# Patient Record
Sex: Male | Born: 1970 | Race: Black or African American | Hispanic: No | Marital: Married | Smoking: Never smoker
Health system: Southern US, Community
[De-identification: ages and names within clinical notes are randomized; demographics above are authoritative.]

## PROBLEM LIST (undated history)

## (undated) DIAGNOSIS — I10 Essential (primary) hypertension: Secondary | ICD-10-CM

## (undated) DIAGNOSIS — F419 Anxiety disorder, unspecified: Secondary | ICD-10-CM

## (undated) HISTORY — PX: NO PAST SURGERIES: SHX2092

---

## 1997-11-11 ENCOUNTER — Emergency Department (HOSPITAL_COMMUNITY): Admission: EM | Admit: 1997-11-11 | Discharge: 1997-11-11 | Payer: Self-pay | Admitting: Emergency Medicine

## 2011-03-17 ENCOUNTER — Other Ambulatory Visit: Payer: Self-pay | Admitting: Occupational Medicine

## 2011-03-17 ENCOUNTER — Ambulatory Visit: Payer: Self-pay

## 2011-03-17 DIAGNOSIS — R52 Pain, unspecified: Secondary | ICD-10-CM

## 2012-10-28 ENCOUNTER — Emergency Department (HOSPITAL_COMMUNITY)
Admission: EM | Admit: 2012-10-28 | Discharge: 2012-10-28 | Disposition: A | Discharge status: Home / Self Care | Payer: 59 | Attending: Emergency Medicine | Admitting: Emergency Medicine

## 2012-10-28 DIAGNOSIS — T783XXA Angioneurotic edema, initial encounter: Secondary | ICD-10-CM

## 2012-10-28 DIAGNOSIS — Y9389 Activity, other specified: Secondary | ICD-10-CM | POA: Insufficient documentation

## 2012-10-28 DIAGNOSIS — Y929 Unspecified place or not applicable: Secondary | ICD-10-CM | POA: Insufficient documentation

## 2012-10-28 DIAGNOSIS — IMO0002 Reserved for concepts with insufficient information to code with codable children: Secondary | ICD-10-CM | POA: Insufficient documentation

## 2012-10-28 DIAGNOSIS — Z79899 Other long term (current) drug therapy: Secondary | ICD-10-CM | POA: Insufficient documentation

## 2012-10-28 MED ORDER — METHYLPREDNISOLONE SODIUM SUCC 125 MG IJ SOLR
125.0000 mg | Freq: Once | INTRAMUSCULAR | Status: AC
  Administered 2012-10-28: 125 mg via INTRAVENOUS
  Filled 2012-10-28: qty 2

## 2012-10-28 MED ORDER — FAMOTIDINE IN NACL 20-0.9 MG/50ML-% IV SOLN
20.0000 mg | Freq: Once | INTRAVENOUS | Status: AC
  Administered 2012-10-28: 20 mg via INTRAVENOUS
  Filled 2012-10-28: qty 50

## 2012-10-28 MED ORDER — HYDROCODONE-ACETAMINOPHEN 5-325 MG PO TABS: 1.0000 | ORAL_TABLET | Freq: Once | ORAL | Status: DC

## 2012-10-28 NOTE — ED Provider Notes (Signed)
History     CSN: [id number]  Arrival date & time 10/28/12  0015   First MD Initiated Contact with Patient 10/28/12 0030      Chief Complaint  Patient presents with  . Allergic Reaction    (Consider location/radiation/quality/duration/timing/severity/associated sxs/prior treatment) Patient is a 42 y.o. male presenting with allergic reaction. The history is provided by the patient.  Allergic Reaction The primary symptoms are  angioedema. The current episode started 1 to 2 hours ago. The problem has been rapidly worsening.  The onset of the reaction was associated with eating.    No past medical history on file.  No past surgical history on file.  No family history on file.  History  Substance Use Topics  . Smoking status: Not on file  . Smokeless tobacco: Not on file  . Alcohol Use: Not on file      Review of Systems  All other systems reviewed and are negative.    Allergies  Review of patient's allergies indicates not on file.  Home Medications  No current outpatient prescriptions on file.  BP 169/74  Pulse 78  Temp(Src) 98.3 F (36.8 C) (Oral)  SpO2 97%  Physical Exam  Constitutional: He is oriented to person, place, and time. He appears well-developed and well-nourished.  HENT:  Head: Normocephalic and atraumatic.  Upper lip swelling.  No lingual or pharyngeal involvement  Eyes: Conjunctivae are normal. Pupils are equal, round, and reactive to light.  Neck: Normal range of motion. Neck supple.  Cardiovascular: Normal rate, regular rhythm, normal heart sounds and intact distal pulses.   Pulmonary/Chest: Effort normal and breath sounds normal.  Abdominal: Soft. Bowel sounds are normal.  Neurological: He is alert and oriented to person, place, and time.  Skin: Skin is warm and dry.  Psychiatric: He has a normal mood and affect. His behavior is normal. Judgment and thought content normal.    ED Course  Procedures (including critical care time)  Labs  Reviewed - No data to display No results found.   No diagnosis found.    MDM  + angioedema, on lisinopril.  Did not take today.  Will h2, steroid, reassess        Rosanne Ashing, MD 10/28/12 [phone]

## 2012-10-28 NOTE — ED Notes (Signed)
Pt states that after eating spaghetti and meatballs. He self administered diphenhydramine x 2 (expired), and 10 ml of liquid diphenhydramine. Pt has a swollen upper lip, and denies difficulty breathing or swallowing.

## 2018-11-19 ENCOUNTER — Emergency Department (HOSPITAL_COMMUNITY)
Admission: EM | Admit: 2018-11-19 | Discharge: 2018-11-19 | Disposition: A | Discharge status: Home / Self Care | Payer: Worker's Compensation | Attending: Emergency Medicine | Admitting: Emergency Medicine

## 2018-11-19 ENCOUNTER — Emergency Department (HOSPITAL_COMMUNITY): Payer: Worker's Compensation

## 2018-11-19 ENCOUNTER — Encounter (HOSPITAL_COMMUNITY): Payer: Self-pay | Admitting: Emergency Medicine

## 2018-11-19 DIAGNOSIS — Y9389 Activity, other specified: Secondary | ICD-10-CM | POA: Diagnosis not present

## 2018-11-19 DIAGNOSIS — S50311A Abrasion of right elbow, initial encounter: Secondary | ICD-10-CM | POA: Insufficient documentation

## 2018-11-19 DIAGNOSIS — S8255XA Nondisplaced fracture of medial malleolus of left tibia, initial encounter for closed fracture: Secondary | ICD-10-CM | POA: Diagnosis not present

## 2018-11-19 DIAGNOSIS — W1789XA Other fall from one level to another, initial encounter: Secondary | ICD-10-CM | POA: Insufficient documentation

## 2018-11-19 DIAGNOSIS — I10 Essential (primary) hypertension: Secondary | ICD-10-CM | POA: Diagnosis not present

## 2018-11-19 DIAGNOSIS — S89392A Other physeal fracture of lower end of left fibula, initial encounter for closed fracture: Secondary | ICD-10-CM | POA: Diagnosis not present

## 2018-11-19 DIAGNOSIS — Z23 Encounter for immunization: Secondary | ICD-10-CM | POA: Diagnosis not present

## 2018-11-19 DIAGNOSIS — Y990 Civilian activity done for income or pay: Secondary | ICD-10-CM | POA: Diagnosis not present

## 2018-11-19 DIAGNOSIS — Y9289 Other specified places as the place of occurrence of the external cause: Secondary | ICD-10-CM | POA: Insufficient documentation

## 2018-11-19 DIAGNOSIS — S82892A Other fracture of left lower leg, initial encounter for closed fracture: Secondary | ICD-10-CM

## 2018-11-19 DIAGNOSIS — S99912A Unspecified injury of left ankle, initial encounter: Secondary | ICD-10-CM | POA: Diagnosis present

## 2018-11-19 HISTORY — DX: Essential (primary) hypertension: I10

## 2018-11-19 MED ORDER — TETANUS-DIPHTH-ACELL PERTUSSIS 5-2.5-18.5 LF-MCG/0.5 IM SUSP
0.5000 mL | Freq: Once | INTRAMUSCULAR | Status: AC
  Administered 2018-11-19: 11:00:00 0.5 mL via INTRAMUSCULAR
  Filled 2018-11-19: qty 0.5

## 2018-11-19 MED ORDER — HYDROCODONE-ACETAMINOPHEN 5-325 MG PO TABS
1.0000 | ORAL_TABLET | Freq: Once | ORAL | Status: AC
  Administered 2018-11-19: 1 via ORAL
  Filled 2018-11-19: qty 1

## 2018-11-19 MED ORDER — HYDROCODONE-ACETAMINOPHEN 5-325 MG PO TABS: 1.0000 | ORAL_TABLET | Freq: Four times a day (QID) | ORAL | 0 refills | Status: DC | PRN

## 2018-11-19 NOTE — Progress Notes (Signed)
Orthopedic Tech Progress Note Patient Details:  Randy Stewart 1970-10-05 [id number]  Ortho Devices Type of Ortho Device: Crutches, Post (short leg) splint, Stirrup splint Ortho Device/Splint Location: lle Ortho Device/Splint Interventions: Ordered, Application, Adjustment   Post Interventions Patient Tolerated: Well Instructions Provided: Care of device, Adjustment of device   Trinna Post 11/19/2018, 12:05 PM

## 2018-11-19 NOTE — ED Notes (Signed)
Paged ortho tech 

## 2018-11-19 NOTE — Consult Note (Signed)
Reason for Consult:Left ankle fx Referring Physician: D Eustaquio MaizeYelverton  Abbas J Stewart is an 48 y.o. male.  HPI: Randy Stewart was on the back of a semi when it moved away from the loading dock and he fell to the ground. He landed on his feet and had immediate pain in his left ankle and knee but worst in the ankle. He was unable to bear weight afterwards and came to the ED for evaluation. X-rays showed an ankle fx and orthopedic surgery was consulted. He c/o localized pain to the ankle. His knee is not painful anymore.  Past Medical History:  Diagnosis Date  . Hypertension     History reviewed. No pertinent surgical history.  History reviewed. No pertinent family history.  Social History:  reports that he has never smoked. He has never used smokeless tobacco. He reports that he does not drink alcohol. No history on file for drug.  Allergies:  Allergies  Allergen Reactions  . Lisinopril     Medications: I have reviewed the patient's current medications.  No results found for this or any previous visit (from the past 48 hour(s)).  Dg Ankle Complete Left  Result Date: 11/19/2018 CLINICAL DATA:  Left ankle pain after falling off the back of a truck. EXAM: LEFT ANKLE COMPLETE - 3+ VIEW COMPARISON:  None. FINDINGS: Small avulsion fracture at the tip of the medial malleolus. Essentially nondisplaced fracture of the distal fibular metaphysis extending into the syndesmosis. No dislocation. The ankle mortise is symmetric. The talar dome is intact. Moderate tibiotalar joint effusion. Joint spaces are preserved. Bone mineralization is normal. Small plantar and Achilles enthesophytes. Moderate lateral and mild medial hindfoot soft tissue swelling. IMPRESSION: 1. Essentially nondisplaced fracture of the distal fibular metaphysis extending into the syndesmosis. No ankle mortise disruption. 2. Small avulsion fracture of the medial malleolus. Electronically Signed   By: Obie DredgeWilliam T Derry M.D.   On: 11/19/2018 11:02     Review of Systems  Constitutional: Negative for weight loss.  HENT: Negative for ear discharge, ear pain, hearing loss and tinnitus.   Eyes: Negative for blurred vision, double vision, photophobia and pain.  Respiratory: Negative for cough, sputum production and shortness of breath.   Cardiovascular: Negative for chest pain.  Gastrointestinal: Negative for abdominal pain, nausea and vomiting.  Genitourinary: Negative for dysuria, flank pain, frequency and urgency.  Musculoskeletal: Positive for joint pain (Left ankle, left knee). Negative for back pain, falls, myalgias and neck pain.  Neurological: Negative for dizziness, tingling, sensory change, focal weakness, loss of consciousness and headaches.  Endo/Heme/Allergies: Does not bruise/bleed easily.  Psychiatric/Behavioral: Negative for depression, memory loss and substance abuse. The patient is not nervous/anxious.    Blood pressure (!) 142/82, pulse 60, temperature 98.8 F (37.1 C), temperature source Oral, resp. rate 16, SpO2 100 %. Physical Exam  Constitutional: He appears well-developed and well-nourished. No distress.  HENT:  Head: Normocephalic and atraumatic.  Eyes: Conjunctivae are normal. Right eye exhibits no discharge. Left eye exhibits no discharge. No scleral icterus.  Neck: Normal range of motion.  Cardiovascular: Normal rate and regular rhythm.  Respiratory: Effort normal. No respiratory distress.  Musculoskeletal:     Comments: LLE No traumatic wounds, ecchymosis, or rash  Ankle mod TTP, mild TTP medial knee  No knee or ankle effusion  Knee stable to varus/ valgus and anterior/posterior stress  Sens DPN, SPN, TN intact  Motor EHL 5/5  DP 1+, No significant edema  Neurological: He is alert.  Skin: Skin is warm and dry.  He is not diaphoretic.  Psychiatric: He has a normal mood and affect. His behavior is normal.    Assessment/Plan: Left ankle fx -- Splint, NWB. F/u with Dr. Carola Frost in office next week. Likely  grade 1 MCL strain HTN    Freeman Caldron, PA-C Orthopedic Surgery [phone] 11/19/2018, 12:07 PM

## 2018-11-19 NOTE — ED Triage Notes (Signed)
Pt here from work after falling out of the back of a trailer , did not hit his head , no loc , pt is c/o left ankle leg and hip pain

## 2018-11-19 NOTE — Discharge Instructions (Signed)
You have been diagnosed with a broken left ankle.  Use crutches when walking.  Keep your ankle elevated when resting.  Take vicodin as needed for pain but be aware that it can cause drowsiness. Please call and follow closely with orthopedist for further management of your condition.

## 2018-11-19 NOTE — ED Notes (Signed)
Patient transported to X-ray 

## 2018-11-19 NOTE — ED Provider Notes (Signed)
[facility]   CSN: [id number] Arrival date & time: 11/19/18  1018    History   Chief Complaint Chief Complaint  Patient presents with  . Leg Injury    HPI Randy Stewart is a 48 y.o. male.     The history is provided by the patient. No language interpreter was used.     48 year old male presented for evaluation of a fall.  Patient report while at work today he fell off the back of a tractor trailer when his coworker drove off.  He landed awkwardly on his left leg and injured his left ankle.  He also struck his right elbow and suffered abrasion.  Pain is primarily to left ankle described as a sharp sensation radiates up his leg, 8 out of 10, worsening with movement.  He denies hitting his head or loss of consciousness.  He denies any pain to his chest abdomen or his back.  He denies any numbness or weakness.  No specific treatment tried.  He has not ambulated since the fall.  He is not up-to-date with tetanus.  Past Medical History:  Diagnosis Date  . Hypertension     There are no active problems to display for this patient.   History reviewed. No pertinent surgical history.      Home Medications    Prior to Admission medications   Not on File    Family History History reviewed. No pertinent family history.  Social History Social History   Tobacco Use  . Smoking status: Never Smoker  . Smokeless tobacco: Never Used  Substance Use Topics  . Alcohol use: Never    Frequency: Never  . Drug use: Not on file     Allergies   Lisinopril   Review of Systems Review of Systems  Musculoskeletal: Positive for joint swelling.  Skin: Positive for wound.  Neurological: Negative for numbness.     Physical Exam Updated Vital Signs BP (!) 163/85   Pulse 62   Temp 98.8 F (37.1 C) (Oral)   Resp 18   SpO2 100%   Physical Exam Vitals signs and nursing note reviewed.  Constitutional:      General: He is  not in acute distress.    Appearance: He is well-developed.  HENT:     Head: Atraumatic.  Eyes:     Conjunctiva/sclera: Conjunctivae normal.  Neck:     Musculoskeletal: Neck supple.  Musculoskeletal:        General: Tenderness (Left ankle: Edema and tenderness noted about the ankle with tenderness to both medial and lateral malleoli but no obvious deformity.  Intact dorsalis pedis pulse.) present.     Comments: Left hip, knee, and foot are nontender.  No midline spine tenderness.  Skin:    Findings: No rash.     Comments: Abrasion noted about the right elbow with normal elbow flexion and extension and no crepitus or deformity.  Neurological:     Mental Status: He is alert.      ED Treatments / Results  Labs (all labs ordered are listed, but only abnormal results are displayed) Labs Reviewed - No data to display  EKG None  Radiology Dg Ankle Complete Left  Result Date: 11/19/2018 CLINICAL DATA:  Left ankle pain after falling off the back of a truck. EXAM: LEFT ANKLE COMPLETE - 3+ VIEW COMPARISON:  None. FINDINGS: Small avulsion fracture at the tip of the medial malleolus. Essentially nondisplaced fracture of the distal  fibular metaphysis extending into the syndesmosis. No dislocation. The ankle mortise is symmetric. The talar dome is intact. Moderate tibiotalar joint effusion. Joint spaces are preserved. Bone mineralization is normal. Small plantar and Achilles enthesophytes. Moderate lateral and mild medial hindfoot soft tissue swelling. IMPRESSION: 1. Essentially nondisplaced fracture of the distal fibular metaphysis extending into the syndesmosis. No ankle mortise disruption. 2. Small avulsion fracture of the medial malleolus. Electronically Signed   By: Obie Dredge M.D.   On: 11/19/2018 11:02    Procedures Procedures (including critical care time)  Medications Ordered in ED Medications  Tdap (BOOSTRIX) injection 0.5 mL (0.5 mLs Intramuscular Given 11/19/18 1101)   HYDROcodone-acetaminophen (NORCO/VICODIN) 5-325 MG per tablet 1 tablet (1 tablet Oral Given 11/19/18 1100)     Initial Impression / Assessment and Plan / ED Course  I have reviewed the triage vital signs and the nursing notes.  Pertinent labs & imaging results that were available during my care of the patient were reviewed by me and considered in my medical decision making (see chart for details).        BP (!) 140/56   Pulse 68   Temp 98.8 F (37.1 C) (Oral)   Resp 16   SpO2 97%    Final Clinical Impressions(s) / ED Diagnoses   Final diagnoses:  Ankle fracture, left, closed, initial encounter    ED Discharge Orders         Ordered    HYDROcodone-acetaminophen (NORCO/VICODIN) 5-325 MG tablet  Every 6 hours PRN     11/19/18 1206         10:40 AM Patient fell off a tractor trailer at work today prior to arrival.  Pain primarily to the left ankle.  X-ray ordered.  He also has an abrasion to his right elbow but with normal elbow flexion extension.  Tdap given, pain medication given.  11:58 AM Xray of L ankle demonstrates nondisplaced fracture of the distal fibular metaphysis extending into the syndesmosis.  This is a closed fracture.  Pt placed in posterior/stirrup splint, with crutches provided. I have consulted with ortho for close f/u.   Fayrene Helper, PA-C 11/19/18 1213    Loren Racer, MD 11/21/18 [phone]

## 2018-11-19 NOTE — ED Notes (Signed)
Patient verbalizes understanding of discharge instructions. Opportunity for questioning and answers were provided. Pt discharged from ED. 

## 2018-11-24 ENCOUNTER — Other Ambulatory Visit: Payer: Self-pay

## 2018-11-24 ENCOUNTER — Encounter (HOSPITAL_BASED_OUTPATIENT_CLINIC_OR_DEPARTMENT_OTHER): Payer: Self-pay | Admitting: *Deleted

## 2018-11-24 ENCOUNTER — Other Ambulatory Visit (HOSPITAL_COMMUNITY): Payer: Self-pay | Admitting: Orthopedic Surgery

## 2018-11-26 ENCOUNTER — Other Ambulatory Visit: Payer: Self-pay

## 2018-11-26 ENCOUNTER — Encounter (HOSPITAL_BASED_OUTPATIENT_CLINIC_OR_DEPARTMENT_OTHER)
Admission: RE | Admit: 2018-11-26 | Discharge: 2018-11-26 | Disposition: A | Discharge status: Home / Self Care | Payer: Worker's Compensation | Source: Ambulatory Visit | Attending: Orthopedic Surgery | Admitting: Orthopedic Surgery

## 2018-11-26 DIAGNOSIS — Z01812 Encounter for preprocedural laboratory examination: Secondary | ICD-10-CM | POA: Diagnosis present

## 2018-11-26 LAB — BASIC METABOLIC PANEL
Anion gap: 9 (ref 5–15)
BUN: 14 mg/dL (ref 6–20)
CO2: 26 mmol/L (ref 22–32)
Calcium: 9.8 mg/dL (ref 8.9–10.3)
Chloride: 107 mmol/L (ref 98–111)
Creatinine, Ser: 0.85 mg/dL (ref 0.61–1.24)
GFR calc Af Amer: >60 mL/min (ref >60)
GFR calc non Af Amer: >60 mL/min (ref >60)
Glucose, Bld: 107 mg/dL — ABNORMAL HIGH (ref 70–99)
Potassium: 5 mmol/L (ref 3.5–5.1)
Sodium: 142 mmol/L (ref 135–145)

## 2018-11-26 NOTE — Progress Notes (Signed)
Ensure pre surgery drink given with instructions to complete by 0445 dos, pt verbalized understanding. 

## 2018-11-30 ENCOUNTER — Ambulatory Visit (HOSPITAL_BASED_OUTPATIENT_CLINIC_OR_DEPARTMENT_OTHER)
Admission: RE | Admit: 2018-11-30 | Discharge: 2018-11-30 | Disposition: A | Discharge status: Home / Self Care | Payer: Worker's Compensation | Attending: Orthopedic Surgery | Admitting: Orthopedic Surgery

## 2018-11-30 ENCOUNTER — Ambulatory Visit (HOSPITAL_BASED_OUTPATIENT_CLINIC_OR_DEPARTMENT_OTHER): Payer: Worker's Compensation | Admitting: Certified Registered"

## 2018-11-30 ENCOUNTER — Encounter (HOSPITAL_BASED_OUTPATIENT_CLINIC_OR_DEPARTMENT_OTHER)
Admission: RE | Disposition: A | Discharge status: Home / Self Care | Payer: Self-pay | Source: Home / Self Care | Attending: Orthopedic Surgery

## 2018-11-30 ENCOUNTER — Encounter (HOSPITAL_BASED_OUTPATIENT_CLINIC_OR_DEPARTMENT_OTHER): Payer: Self-pay | Admitting: Certified Registered"

## 2018-11-30 DIAGNOSIS — I10 Essential (primary) hypertension: Secondary | ICD-10-CM | POA: Diagnosis not present

## 2018-11-30 DIAGNOSIS — F419 Anxiety disorder, unspecified: Secondary | ICD-10-CM | POA: Diagnosis not present

## 2018-11-30 DIAGNOSIS — Z6841 Body Mass Index (BMI) 40.0 and over, adult: Secondary | ICD-10-CM | POA: Diagnosis not present

## 2018-11-30 DIAGNOSIS — S82842A Displaced bimalleolar fracture of left lower leg, initial encounter for closed fracture: Secondary | ICD-10-CM | POA: Diagnosis present

## 2018-11-30 DIAGNOSIS — Z888 Allergy status to other drugs, medicaments and biological substances status: Secondary | ICD-10-CM | POA: Diagnosis not present

## 2018-11-30 DIAGNOSIS — Z79899 Other long term (current) drug therapy: Secondary | ICD-10-CM | POA: Insufficient documentation

## 2018-11-30 HISTORY — PX: ORIF ANKLE FRACTURE: SHX5408

## 2018-11-30 HISTORY — DX: Anxiety disorder, unspecified: F41.9

## 2018-11-30 SURGERY — OPEN REDUCTION INTERNAL FIXATION (ORIF) ANKLE FRACTURE
Anesthesia: General | Laterality: Left

## 2018-11-30 MED ORDER — 0.9 % SODIUM CHLORIDE (POUR BTL) OPTIME
TOPICAL | Status: DC | PRN
  Administered 2018-11-30: 09:00:00 1000 mL

## 2018-11-30 MED ORDER — ONDANSETRON HCL 4 MG/2ML IJ SOLN
INTRAMUSCULAR | Status: DC | PRN
  Administered 2018-11-30: 4 mg via INTRAVENOUS

## 2018-11-30 MED ORDER — SCOPOLAMINE 1 MG/3DAYS TD PT72: 1.0000 | MEDICATED_PATCH | Freq: Once | TRANSDERMAL | Status: DC | PRN

## 2018-11-30 MED ORDER — DEXTROSE 5 % IV SOLN
3.0000 g | Freq: Once | INTRAVENOUS | Status: AC
  Administered 2018-11-30: 09:00:00 3 g via INTRAVENOUS

## 2018-11-30 MED ORDER — MEPERIDINE HCL 25 MG/ML IJ SOLN: 6.2500 mg | INTRAMUSCULAR | Status: DC | PRN

## 2018-11-30 MED ORDER — HYDROMORPHONE HCL 1 MG/ML IJ SOLN: 0.2500 mg | INTRAMUSCULAR | Status: DC | PRN

## 2018-11-30 MED ORDER — DOCUSATE SODIUM 100 MG PO CAPS: 100.0000 mg | ORAL_CAPSULE | Freq: Two times a day (BID) | ORAL | 0 refills | Status: DC

## 2018-11-30 MED ORDER — ASPIRIN EC 81 MG PO TBEC: 81.0000 mg | DELAYED_RELEASE_TABLET | Freq: Two times a day (BID) | ORAL | 0 refills | Status: DC

## 2018-11-30 MED ORDER — CEFAZOLIN SODIUM-DEXTROSE 1-4 GM/50ML-% IV SOLN
INTRAVENOUS | Status: AC
  Filled 2018-11-30: qty 50

## 2018-11-30 MED ORDER — CHLORHEXIDINE GLUCONATE 4 % EX LIQD: 60.0000 mL | Freq: Once | CUTANEOUS | Status: DC

## 2018-11-30 MED ORDER — SENNA 8.6 MG PO TABS: 2.0000 | ORAL_TABLET | Freq: Two times a day (BID) | ORAL | 0 refills | Status: DC

## 2018-11-30 MED ORDER — OXYCODONE HCL 5 MG/5ML PO SOLN: 5.0000 mg | Freq: Once | ORAL | Status: DC | PRN

## 2018-11-30 MED ORDER — MIDAZOLAM HCL 2 MG/2ML IJ SOLN
INTRAMUSCULAR | Status: AC
  Filled 2018-11-30: qty 2

## 2018-11-30 MED ORDER — ROPIVACAINE HCL 5 MG/ML IJ SOLN
INTRAMUSCULAR | Status: DC | PRN
  Administered 2018-11-30: 30 mL via PERINEURAL

## 2018-11-30 MED ORDER — LACTATED RINGERS IV SOLN
INTRAVENOUS | Status: DC
  Administered 2018-11-30: 07:00:00 via INTRAVENOUS

## 2018-11-30 MED ORDER — PROPOFOL 10 MG/ML IV BOLUS
INTRAVENOUS | Status: DC | PRN
  Administered 2018-11-30: 300 mg via INTRAVENOUS

## 2018-11-30 MED ORDER — DEXAMETHASONE SODIUM PHOSPHATE 10 MG/ML IJ SOLN
INTRAMUSCULAR | Status: DC | PRN
  Administered 2018-11-30: 10 mg via INTRAVENOUS

## 2018-11-30 MED ORDER — PROMETHAZINE HCL 25 MG/ML IJ SOLN: 6.2500 mg | INTRAMUSCULAR | Status: DC | PRN

## 2018-11-30 MED ORDER — PROPOFOL 500 MG/50ML IV EMUL
INTRAVENOUS | Status: DC | PRN
  Administered 2018-11-30: 25 ug/kg/min via INTRAVENOUS

## 2018-11-30 MED ORDER — FENTANYL CITRATE (PF) 100 MCG/2ML IJ SOLN
50.0000 ug | INTRAMUSCULAR | Status: DC | PRN
  Administered 2018-11-30: 100 ug via INTRAVENOUS

## 2018-11-30 MED ORDER — LIDOCAINE HCL (CARDIAC) PF 100 MG/5ML IV SOSY
PREFILLED_SYRINGE | INTRAVENOUS | Status: DC | PRN
  Administered 2018-11-30: 30 mg via INTRAVENOUS

## 2018-11-30 MED ORDER — FENTANYL CITRATE (PF) 100 MCG/2ML IJ SOLN
INTRAMUSCULAR | Status: AC
  Filled 2018-11-30: qty 2

## 2018-11-30 MED ORDER — SODIUM CHLORIDE 0.9 % IV SOLN: INTRAVENOUS | Status: DC

## 2018-11-30 MED ORDER — CEFAZOLIN SODIUM-DEXTROSE 2-4 GM/100ML-% IV SOLN
INTRAVENOUS | Status: AC
  Filled 2018-11-30: qty 100

## 2018-11-30 MED ORDER — VANCOMYCIN HCL 500 MG IV SOLR
INTRAVENOUS | Status: DC | PRN
  Administered 2018-11-30: 500 mg via TOPICAL

## 2018-11-30 MED ORDER — OXYCODONE HCL 5 MG PO TABS: 5.0000 mg | ORAL_TABLET | Freq: Once | ORAL | Status: DC | PRN

## 2018-11-30 MED ORDER — CEFAZOLIN SODIUM-DEXTROSE 2-4 GM/100ML-% IV SOLN: 2.0000 g | INTRAVENOUS | Status: DC

## 2018-11-30 MED ORDER — MIDAZOLAM HCL 2 MG/2ML IJ SOLN
1.0000 mg | INTRAMUSCULAR | Status: DC | PRN
  Administered 2018-11-30: 2 mg via INTRAVENOUS

## 2018-11-30 MED ORDER — OXYCODONE HCL 5 MG PO TABS: 5.0000 mg | ORAL_TABLET | ORAL | 0 refills | Status: AC | PRN

## 2018-11-30 SURGICAL SUPPLY — 76 items
APL PRP STRL LF DISP 70% ISPRP (MISCELLANEOUS) ×1
BANDAGE ESMARK 6X9 LF (GAUZE/BANDAGES/DRESSINGS) ×1 IMPLANT
BIT DRILL 2.5X2.75 QC CALB (BIT) ×3 IMPLANT
BIT DRILL 3.5X5.5 QC CALB (BIT) ×2 IMPLANT
BLADE SURG 15 STRL LF DISP TIS (BLADE) ×2 IMPLANT
BLADE SURG 15 STRL SS (BLADE) ×6
BNDG CMPR 9X4 STRL LF SNTH (GAUZE/BANDAGES/DRESSINGS)
BNDG CMPR 9X6 STRL LF SNTH (GAUZE/BANDAGES/DRESSINGS) ×1
BNDG COHESIVE 4X5 TAN STRL (GAUZE/BANDAGES/DRESSINGS) ×3 IMPLANT
BNDG COHESIVE 6X5 TAN STRL LF (GAUZE/BANDAGES/DRESSINGS) ×3 IMPLANT
BNDG ESMARK 4X9 LF (GAUZE/BANDAGES/DRESSINGS) IMPLANT
BNDG ESMARK 6X9 LF (GAUZE/BANDAGES/DRESSINGS) ×3
CANISTER SUCT 1200ML W/VALVE (MISCELLANEOUS) ×3 IMPLANT
CHLORAPREP W/TINT 26 (MISCELLANEOUS) ×3 IMPLANT
COVER BACK TABLE REUSABLE LG (DRAPES) ×3 IMPLANT
COVER WAND RF STERILE (DRAPES) IMPLANT
CUFF TOURN SGL QUICK 34 (TOURNIQUET CUFF)
CUFF TOURN SGL QUICK 42 (TOURNIQUET CUFF) ×2 IMPLANT
CUFF TRNQT CYL 34X4.125X (TOURNIQUET CUFF) IMPLANT
DECANTER SPIKE VIAL GLASS SM (MISCELLANEOUS) IMPLANT
DRAPE EXTREMITY T 121X128X90 (DISPOSABLE) ×3 IMPLANT
DRAPE OEC MINIVIEW 54X84 (DRAPES) ×3 IMPLANT
DRAPE U-SHAPE 47X51 STRL (DRAPES) ×3 IMPLANT
DRSG MEPITEL 4X7.2 (GAUZE/BANDAGES/DRESSINGS) ×3 IMPLANT
DRSG PAD ABDOMINAL 8X10 ST (GAUZE/BANDAGES/DRESSINGS) ×6 IMPLANT
ELECT REM PT RETURN 9FT ADLT (ELECTROSURGICAL) ×3
ELECTRODE REM PT RTRN 9FT ADLT (ELECTROSURGICAL) ×1 IMPLANT
GAUZE SPONGE 4X4 12PLY STRL (GAUZE/BANDAGES/DRESSINGS) ×3 IMPLANT
GLOVE BIO SURGEON STRL SZ8 (GLOVE) ×3 IMPLANT
GLOVE BIOGEL PI IND STRL 8 (GLOVE) ×2 IMPLANT
GLOVE BIOGEL PI INDICATOR 8 (GLOVE) ×4
GLOVE ECLIPSE 8.0 STRL XLNG CF (GLOVE) ×3 IMPLANT
GOWN STRL REUS W/ TWL LRG LVL3 (GOWN DISPOSABLE) ×1 IMPLANT
GOWN STRL REUS W/ TWL XL LVL3 (GOWN DISPOSABLE) ×2 IMPLANT
GOWN STRL REUS W/TWL LRG LVL3 (GOWN DISPOSABLE) ×3
GOWN STRL REUS W/TWL XL LVL3 (GOWN DISPOSABLE) ×6
NEEDLE HYPO 22GX1.5 SAFETY (NEEDLE) IMPLANT
NS IRRIG 1000ML POUR BTL (IV SOLUTION) ×3 IMPLANT
PACK BASIN DAY SURGERY FS (CUSTOM PROCEDURE TRAY) ×3 IMPLANT
PAD CAST 4YDX4 CTTN HI CHSV (CAST SUPPLIES) ×1 IMPLANT
PADDING CAST ABS 4INX4YD NS (CAST SUPPLIES)
PADDING CAST ABS COTTON 4X4 ST (CAST SUPPLIES) IMPLANT
PADDING CAST COTTON 4X4 STRL (CAST SUPPLIES) ×3
PADDING CAST COTTON 6X4 STRL (CAST SUPPLIES) ×3 IMPLANT
PENCIL BUTTON HOLSTER BLD 10FT (ELECTRODE) ×3 IMPLANT
PLATE ACE 100DEG 7HOLE (Plate) ×2 IMPLANT
SANITIZER HAND PURELL 535ML FO (MISCELLANEOUS) ×3 IMPLANT
SCREW CORT FT 32X3.5XNONLOCK (Screw) ×1 IMPLANT
SCREW CORTICAL 3.5MM  16MM (Screw) ×6 IMPLANT
SCREW CORTICAL 3.5MM  32MM (Screw) ×2 IMPLANT
SCREW CORTICAL 3.5MM 16MM (Screw) IMPLANT
SCREW CORTICAL 3.5MM 18MM (Screw) ×2 IMPLANT
SCREW CORTICAL 3.5MM 26MM (Screw) ×2 IMPLANT
SCREW CORTICAL 3.5MM 32MM (Screw) ×1 IMPLANT
SHEET MEDIUM DRAPE 40X70 STRL (DRAPES) ×3 IMPLANT
SLEEVE SCD COMPRESS KNEE MED (MISCELLANEOUS) ×3 IMPLANT
SPLINT FAST PLASTER 5X30 (CAST SUPPLIES) ×2
SPLINT PLASTER CAST FAST 5X30 (CAST SUPPLIES) ×20 IMPLANT
SPONGE LAP 18X18 RF (DISPOSABLE) ×3 IMPLANT
STOCKINETTE 6  STRL (DRAPES) ×2
STOCKINETTE 6 STRL (DRAPES) ×1 IMPLANT
SUCTION FRAZIER HANDLE 10FR (MISCELLANEOUS) ×2
SUCTION TUBE FRAZIER 10FR DISP (MISCELLANEOUS) ×1 IMPLANT
SUT ETHILON 3 0 PS 1 (SUTURE) ×3 IMPLANT
SUT FIBERWIRE #2 38 T-5 BLUE (SUTURE)
SUT MNCRL AB 3-0 PS2 18 (SUTURE) IMPLANT
SUT VIC AB 0 SH 27 (SUTURE) IMPLANT
SUT VIC AB 2-0 SH 27 (SUTURE) ×3
SUT VIC AB 2-0 SH 27XBRD (SUTURE) ×1 IMPLANT
SUTURE FIBERWR #2 38 T-5 BLUE (SUTURE) IMPLANT
SYR BULB 3OZ (MISCELLANEOUS) ×3 IMPLANT
SYR CONTROL 10ML LL (SYRINGE) IMPLANT
TOWEL GREEN STERILE FF (TOWEL DISPOSABLE) ×6 IMPLANT
TUBE CONNECTING 20'X1/4 (TUBING) ×1
TUBE CONNECTING 20X1/4 (TUBING) ×2 IMPLANT
UNDERPAD 30X30 (UNDERPADS AND DIAPERS) ×3 IMPLANT

## 2018-11-30 NOTE — Anesthesia Procedure Notes (Signed)
Procedure Name: LMA Insertion Date/Time: 11/30/2018 9:15 AM Performed by: Sheryn Bison, CRNA Pre-anesthesia Checklist: Patient identified, Emergency Drugs available, Suction available and Patient being monitored Patient Re-evaluated:Patient Re-evaluated prior to induction Oxygen Delivery Method: Circle system utilized Preoxygenation: Pre-oxygenation with 100% oxygen Induction Type: IV induction Ventilation: Mask ventilation without difficulty LMA: LMA inserted LMA Size: 5.0 Number of attempts: 1 Airway Equipment and Method: Bite block Placement Confirmation: positive ETCO2 Tube secured with: Tape Dental Injury: Teeth and Oropharynx as per pre-operative assessment

## 2018-11-30 NOTE — H&P (Signed)
Randy Stewart is an 48 y.o. male.   Chief Complaint: Left ankle pain HPI: The patient is a 48 year old male who injured his ankle last week at work.  He fell out of the back of a truck and sustained a bimalleolar fracture.  He presents now for operative treatment of this displaced and unstable left ankle injury.  Past Medical History:  Diagnosis Date  . Anxiety   . Hypertension     Past Surgical History:  Procedure Laterality Date  . NO PAST SURGERIES      History reviewed. No pertinent family history. Social History:  reports that he has never smoked. He has never used smokeless tobacco. He reports previous alcohol use. He reports that he does not use drugs.  Allergies:  Allergies  Allergen Reactions  . Lisinopril     Medications Prior to Admission  Medication Sig Dispense Refill  . acetaminophen (TYLENOL) 500 MG tablet Take 1,000 mg by mouth every 6 (six) hours as needed for moderate pain (as needed for pain).    Marland Kitchen amLODipine (NORVASC) 10 MG tablet Take 10 mg by mouth daily.    . hydrochlorothiazide (MICROZIDE) 12.5 MG capsule Take 12.5 mg by mouth daily.    Marland Kitchen HYDROcodone-acetaminophen (NORCO/VICODIN) 5-325 MG tablet Take 1 tablet by mouth every 6 (six) hours as needed for moderate pain. 15 tablet 0    No results found for this or any previous visit (from the past 48 hour(s)). No results found.  ROS no recent fever, chills, nausea, vomiting or changes in his appetite  Blood pressure (!) 157/74, pulse 71, temperature 98.6 F (37 C), temperature source Oral, resp. rate (!) 22, height 6\' 1"  (1.854 m), weight (!) 143 kg, SpO2 100 %. Physical Exam  Well-nourished well-developed overweight male in no apparent distress.  Alert and oriented x4.  Mood and affect are normal.  Extraocular motions are intact.  Respirations are unlabored.  Gait is nonweightbearing on the left.  The left ankle is tender to palpation medially and laterally.  Moderate swelling around the ankle but the  skin still wrinkles.  Pulses are palpable in the foot.  Sensibility to light touch is intact dorsally and plantarly at the forefoot.  5 out of 5 strength in plantar flexion and dorsiflexion of the toes.  X-rays: 3 views weight-bearing of the left ankle show a bimalleolar fracture with a small avulsion off the tip of the medial malleolus and a displaced Weber B lateral malleolus fracture.  Assessment/Plan Left ankle bimalleolar fracture -to the operating room today for open treatment with internal fixation.  The risks and benefits of the alternative treatment options have been discussed in detail.  The patient wishes to proceed with surgery and specifically understands risks of bleeding, infection, nerve damage, blood clots, need for additional surgery, amputation and death.   Toni Arthurs, MD Dec 21, 2018, 8:43 AM

## 2018-11-30 NOTE — Anesthesia Postprocedure Evaluation (Signed)
Anesthesia Post Note  Patient: Randy Stewart  Procedure(s) Performed: OPEN REDUCTION INTERNAL FIXATION (ORIF) left ankle lateral malleolar fracture; possible repair of deltoid ligament (Left )     Patient location during evaluation: PACU Anesthesia Type: General Level of consciousness: awake and alert Pain management: pain level controlled Vital Signs Assessment: post-procedure vital signs reviewed and stable Respiratory status: spontaneous breathing, nonlabored ventilation and respiratory function stable Cardiovascular status: blood pressure returned to baseline and stable Postop Assessment: no apparent nausea or vomiting Anesthetic complications: no    Last Vitals:  Vitals:   11/30/18 1030 11/30/18 1115  BP: (!) 143/70 (!) 147/79  Pulse: 63 64  Resp: 17 16  Temp:  36.9 C  SpO2: 98% 97%    Last Pain:  Vitals:   11/30/18 1115  TempSrc:   PainSc: 0-No pain                 Lowella Curb

## 2018-11-30 NOTE — Op Note (Signed)
11/30/2018  10:03 AM  PATIENT:  Randy Stewart  48 y.o. male  PRE-OPERATIVE DIAGNOSIS: Left ankle bimalleolar fracture-displaced and closed  POST-OPERATIVE DIAGNOSIS: Same  Procedure(s): 1.  Open treatment of left ankle bimalleolar fracture with internal fixation 2.  Stress examination of left ankle under fluoroscopy 3.  AP, mortise and lateral radiographs of the left ankle  SURGEON:  Toni ArthursJohn Jakwon Gayton, MD  ASSISTANT: Alfredo MartinezJustin Ollis, PA-C  ANESTHESIA:   General, regional  EBL:  minimal   TOURNIQUET:   Total Tourniquet Time Documented: Thigh (Left) - 26 minutes Total: Thigh (Left) - 26 minutes  COMPLICATIONS:  None apparent  DISPOSITION:  Extubated, awake and stable to recovery.  INDICATION FOR PROCEDURE: The patient is a 48 year old male without significant past medical history.  He fell at work last week injuring his left ankle.  Radiographs reveal a displaced bimalleolar fracture.  He presents now for operative treatment of this displaced and unstable left ankle injury.  The risks and benefits of the alternative treatment options have been discussed in detail.  The patient wishes to proceed with surgery and specifically understands risks of bleeding, infection, nerve damage, blood clots, need for additional surgery, amputation and death.  PROCEDURE IN DETAIL:  After pre operative consent was obtained, and the correct operative site was identified, the patient was brought to the operating room and placed supine on the OR table.  Anesthesia was administered.  Pre-operative antibiotics were administered.  A surgical timeout was taken.  The left lower extremity was prepped and draped in standard sterile fashion with a tourniquet around the thigh.  The extremity was elevated and the tourniquet was inflated to 350 mmHg.  A longitudinal incision was then made over the lateral malleolus.  Sharp dissection was carried down through the subcutaneous tissues to the fracture site.  The fracture was  mobilized and cleaned of all hematoma.  It was irrigated copiously.  The fracture was then reduced and held with a lobster claw and a sharp tenaculum.  A 3.5 mm fully threaded lag screw was inserted from anterior to posterior across the fracture site.  A 7 hole one third tubular plate from the [employer] small frag set was contoured to fit the lateral malleolus.  It was secured proximally with 3 bicortical screws and distally with 2 unicortical screws.  AP, mortise and lateral radiographs confirmed appropriate reduction of the fracture in appropriate position and length of all hardware.  A stress examination was then performed.  Dorsiflexion and external rotation stress was applied to the supinated forefoot.  There was no evidence of widening of the ankle mortise or medial clear space.  The medial malleolus fracture fragment was noted to remain appropriately reduced.  A cotton test was then performed again showing no instability at the syndesmosis.  The wound was irrigated copiously.  The subcutaneous tissues were approximated with inverted simple sutures of 2-0 Vicryl.  The skin incision was closed with a running 3-0 nylon.  Sterile dressings were applied followed by a well-padded short leg splint.  The tourniquet was released after application of the dressings.  The patient was awakened from anesthesia and transported to the recovery room in stable condition.  FOLLOW UP PLAN: Nonweightbearing on the left lower extremity for the next 2 weeks.  Aspirin 81 mg p.o. twice daily for DVT prophylaxis.  Follow-up in the office for suture removal and conversion to a cam boot to initiate early range of motion and weightbearing.  RADIOGRAPHS: AP, mortise and lateral radiographs of  the left ankle are obtained intraoperatively.  These show interval reduction and fixation of the lateral malleolus fracture.  Hardware is appropriately positioned and of the appropriate lengths.  The medial malleolus fracture fragment is  appropriately reduced.  Normal alignment is noted at the ankle syndesmosis.    Alfredo Martinez PA-C was present and scrubbed for the duration of the operative case. His assistance was essential in positioning the patient, prepping and draping, gaining and maintaining exposure, performing the operation, closing and dressing the wounds and applying the splint.

## 2018-11-30 NOTE — Discharge Instructions (Addendum)
Post Anesthesia Home Care Instructions  Activity: Get plenty of rest for the remainder of the day. A responsible individual must stay with you for 24 hours following the procedure.  For the next 24 hours, DO NOT: -Drive a car -Advertising copywriter -Drink alcoholic beverages -Take any medication unless instructed by your physician -Make any legal decisions or sign important papers.  Meals: Start with liquid foods such as gelatin or soup. Progress to regular foods as tolerated. Avoid greasy, spicy, heavy foods. If nausea and/or vomiting occur, drink only clear liquids until the nausea and/or vomiting subsides. Call your physician if vomiting continues.  Special Instructions/Symptoms: Your throat may feel dry or sore from the anesthesia or the breathing tube placed in your throat during surgery. If this causes discomfort, gargle with warm salt water. The discomfort should disappear within 24 hours.  If you had a scopolamine patch placed behind your ear for the management of post- operative nausea and/or vomiting:  1. The medication in the patch is effective for 72 hours, after which it should be removed.  Wrap patch in a tissue and discard in the trash. Wash hands thoroughly with soap and water. 2. You may remove the patch earlier than 72 hours if you experience unpleasant side effects which may include dry mouth, dizziness or visual disturbances. 3. Avoid touching the patch. Wash your hands with soap and water after contact with the patch.      Regional Anesthesia Blocks  1. Numbness or the inability to move the "blocked" extremity may last from 3-48 hours after placement. The length of time depends on the medication injected and your individual response to the medication. If the numbness is not going away after 48 hours, call your surgeon.  2. The extremity that is blocked will need to be protected until the numbness is gone and the  Strength has returned. Because you cannot feel it, you  will need to take extra care to avoid injury. Because it may be weak, you may have difficulty moving it or using it. You may not know what position it is in without looking at it while the block is in effect.  3. For blocks in the legs and feet, returning to weight bearing and walking needs to be done carefully. You will need to wait until the numbness is entirely gone and the strength has returned. You should be able to move your leg and foot normally before you try and bear weight or walk. You will need someone to be with you when you first try to ensure you do not fall and possibly risk injury.  4. Bruising and tenderness at the needle site are common side effects and will resolve in a few days.  5. Persistent numbness or new problems with movement should be communicated to the surgeon or the [facility] [phone] [facility] [phone]).   Toni Arthurs, MD [facility]  Please read the following information regarding your care after surgery.  Medications  You only need a prescription for the narcotic pain medicine (ex. oxycodone, Percocet, Norco).  All of the other medicines listed below are available over the counter. X Aleve 2 pills twice a day for the first 3 days after surgery. X acetominophen (Tylenol) 650 mg every 4-6 hours as you need for minor to moderate pain X oxycodone as prescribed for severe pain  Narcotic pain medicine (ex. oxycodone, Percocet, Vicodin) will cause constipation.  To prevent this problem, take the following medicines while you  are taking any pain medicine. X docusate sodium (Colace) 100 mg twice a day X senna (Senokot) 2 tablets twice a day  X To help prevent blood clots, take a baby aspirin (81 mg) twice a day after surgery.  You should also get up every hour while you are awake to move around.    Weight Bearing X Do not bear any weight on the operated leg or foot.  Cast / Splint / Dressing XX Keep your splint,  cast or dressing clean and dry.  Dont put anything (coat hanger, pencil, etc) down inside of it.  If it gets damp, use a hair dryer on the cool setting to dry it.  If it gets soaked, call the office to schedule an appointment for a cast change.  After your dressing, cast or splint is removed; you may shower, but do not soak or scrub the wound.  Allow the water to run over it, and then gently pat it dry.  Swelling It is normal for you to have swelling where you had surgery.  To reduce swelling and pain, keep your toes above your nose for at least 3 days after surgery.  It may be necessary to keep your foot or leg elevated for several weeks.  If it hurts, it should be elevated.  Follow Up Call my office at [phone] when you are discharged from the hospital or surgery center to schedule an appointment to be seen two weeks after surgery.  Call my office at [phone] if you develop a fever >101.5 F, nausea, vomiting, bleeding from the surgical site or severe pain.

## 2018-11-30 NOTE — Anesthesia Procedure Notes (Signed)
Anesthesia Regional Block: Popliteal block   Pre-Anesthetic Checklist: ,, timeout performed, Correct Patient, Correct Site, Correct Laterality, Correct Procedure, Correct Position, site marked, Risks and benefits discussed,  Surgical consent,  Pre-op evaluation,  At surgeon's request and post-op pain management  Laterality: Left  Prep: chloraprep       Needles:  Injection technique: Single-shot  Needle Type: Stimiplex     Needle Length: 9cm  Needle Gauge: 21     Additional Needles:   Procedures:,,,, ultrasound used (permanent image in chart),,,,  Narrative:  Start time: 11/30/2018 7:48 AM End time: 11/30/2018 7:53 AM Injection made incrementally with aspirations every 5 mL.  Performed by: Personally  Anesthesiologist: Lowella Curb, MD

## 2018-11-30 NOTE — Transfer of Care (Signed)
Immediate Anesthesia Transfer of Care Note  Patient: Randy Stewart  Procedure(s) Performed: OPEN REDUCTION INTERNAL FIXATION (ORIF) left ankle lateral malleolar fracture; possible repair of deltoid ligament (Left )  Patient Location: PACU  Anesthesia Type:GA combined with regional for post-op pain  Level of Consciousness: drowsy and patient cooperative  Airway & Oxygen Therapy: Patient Spontanous Breathing and Patient connected to nasal cannula oxygen  Post-op Assessment: Report given to RN and Post -op Vital signs reviewed and stable  Post vital signs: Reviewed and stable  Last Vitals:  Vitals Value Taken Time  BP    Temp    Pulse 60 11/30/2018 10:02 AM  Resp 12 11/30/2018 10:02 AM  SpO2 100 % 11/30/2018 10:02 AM  Vitals shown include unvalidated device data.  Last Pain:  Vitals:   11/30/18 0723  TempSrc: Oral  PainSc: 1       Patients Stated Pain Goal: 3 (11/30/18 0723)  Complications: No apparent anesthesia complications

## 2018-11-30 NOTE — Progress Notes (Signed)
Assisted Dr. Miller with left, ultrasound guided, popliteal block. Side rails up, monitors on throughout procedure. See vital signs in flow sheet. Tolerated Procedure well. 

## 2018-11-30 NOTE — Anesthesia Preprocedure Evaluation (Signed)
Anesthesia Evaluation  Patient identified by MRN, date of birth, ID band Patient awake    Reviewed: Allergy & Precautions, NPO status , Patient's Chart, lab work & pertinent test results  Airway Mallampati: II  TM Distance: >3 FB Neck ROM: Full    Dental no notable dental hx.    Pulmonary neg pulmonary ROS,    Pulmonary exam normal breath sounds clear to auscultation       Cardiovascular hypertension, Pt. on medications negative cardio ROS Normal cardiovascular exam Rhythm:Regular Rate:Normal     Neuro/Psych Anxiety negative neurological ROS  negative psych ROS   GI/Hepatic negative GI ROS, Neg liver ROS,   Endo/Other  Morbid obesity  Renal/GU negative Renal ROS  negative genitourinary   Musculoskeletal negative musculoskeletal ROS (+)   Abdominal (+) + obese,   Peds negative pediatric ROS (+)  Hematology negative hematology ROS (+)   Anesthesia Other Findings   Reproductive/Obstetrics negative OB ROS                             Anesthesia Physical Anesthesia Plan  ASA: III  Anesthesia Plan: General   Post-op Pain Management:  Regional for Post-op pain   Induction: Intravenous  PONV Risk Score and Plan: 2 and Ondansetron, Midazolam and Treatment may vary due to age or medical condition  Airway Management Planned: LMA  Additional Equipment:   Intra-op Plan:   Post-operative Plan: Extubation in OR  Informed Consent: I have reviewed the patients History and Physical, chart, labs and discussed the procedure including the risks, benefits and alternatives for the proposed anesthesia with the patient or authorized representative who has indicated his/her understanding and acceptance.     Dental advisory given  Plan Discussed with: CRNA  Anesthesia Plan Comments:         Anesthesia Quick Evaluation

## 2018-12-01 ENCOUNTER — Encounter (HOSPITAL_BASED_OUTPATIENT_CLINIC_OR_DEPARTMENT_OTHER): Payer: Self-pay | Admitting: Orthopedic Surgery

## 2018-12-13 ENCOUNTER — Encounter (HOSPITAL_BASED_OUTPATIENT_CLINIC_OR_DEPARTMENT_OTHER): Payer: Self-pay | Admitting: Orthopedic Surgery

## 2020-06-05 ENCOUNTER — Inpatient Hospital Stay (HOSPITAL_COMMUNITY)
Admission: EM | Admit: 2020-06-05 | Discharge: 2020-06-11 | DRG: 454 | Disposition: A | Discharge status: Home / Self Care | Payer: PRIVATE HEALTH INSURANCE | Attending: Neurosurgery | Admitting: Neurosurgery

## 2020-06-05 ENCOUNTER — Emergency Department (HOSPITAL_COMMUNITY): Payer: PRIVATE HEALTH INSURANCE

## 2020-06-05 ENCOUNTER — Other Ambulatory Visit: Payer: Self-pay

## 2020-06-05 ENCOUNTER — Encounter (HOSPITAL_COMMUNITY): Payer: Self-pay

## 2020-06-05 DIAGNOSIS — Z79899 Other long term (current) drug therapy: Secondary | ICD-10-CM | POA: Diagnosis not present

## 2020-06-05 DIAGNOSIS — Z9181 History of falling: Secondary | ICD-10-CM | POA: Diagnosis not present

## 2020-06-05 DIAGNOSIS — W1830XA Fall on same level, unspecified, initial encounter: Secondary | ICD-10-CM | POA: Diagnosis present

## 2020-06-05 DIAGNOSIS — S82892S Other fracture of left lower leg, sequela: Secondary | ICD-10-CM

## 2020-06-05 DIAGNOSIS — M4807 Spinal stenosis, lumbosacral region: Secondary | ICD-10-CM | POA: Diagnosis present

## 2020-06-05 DIAGNOSIS — M2578 Osteophyte, vertebrae: Secondary | ICD-10-CM | POA: Diagnosis present

## 2020-06-05 DIAGNOSIS — S92341A Displaced fracture of fourth metatarsal bone, right foot, initial encounter for closed fracture: Secondary | ICD-10-CM | POA: Diagnosis present

## 2020-06-05 DIAGNOSIS — M50021 Cervical disc disorder at C4-C5 level with myelopathy: Secondary | ICD-10-CM | POA: Diagnosis present

## 2020-06-05 DIAGNOSIS — Z20822 Contact with and (suspected) exposure to covid-19: Secondary | ICD-10-CM | POA: Diagnosis present

## 2020-06-05 DIAGNOSIS — Z7982 Long term (current) use of aspirin: Secondary | ICD-10-CM

## 2020-06-05 DIAGNOSIS — M4714 Other spondylosis with myelopathy, thoracic region: Principal | ICD-10-CM | POA: Diagnosis present

## 2020-06-05 DIAGNOSIS — Z419 Encounter for procedure for purposes other than remedying health state, unspecified: Secondary | ICD-10-CM

## 2020-06-05 DIAGNOSIS — G9520 Unspecified cord compression: Secondary | ICD-10-CM

## 2020-06-05 DIAGNOSIS — W19XXXS Unspecified fall, sequela: Secondary | ICD-10-CM | POA: Diagnosis present

## 2020-06-05 DIAGNOSIS — S92321A Displaced fracture of second metatarsal bone, right foot, initial encounter for closed fracture: Secondary | ICD-10-CM | POA: Diagnosis present

## 2020-06-05 DIAGNOSIS — R52 Pain, unspecified: Secondary | ICD-10-CM

## 2020-06-05 DIAGNOSIS — M4804 Spinal stenosis, thoracic region: Principal | ICD-10-CM | POA: Diagnosis present

## 2020-06-05 DIAGNOSIS — S92331A Displaced fracture of third metatarsal bone, right foot, initial encounter for closed fracture: Secondary | ICD-10-CM | POA: Diagnosis present

## 2020-06-05 DIAGNOSIS — R32 Unspecified urinary incontinence: Secondary | ICD-10-CM | POA: Diagnosis not present

## 2020-06-05 DIAGNOSIS — M4802 Spinal stenosis, cervical region: Secondary | ICD-10-CM | POA: Diagnosis present

## 2020-06-05 DIAGNOSIS — I10 Essential (primary) hypertension: Secondary | ICD-10-CM | POA: Diagnosis present

## 2020-06-05 LAB — CBG MONITORING, ED: Glucose-Capillary: 81 mg/dL (ref 70–99)

## 2020-06-05 MED ORDER — CYCLOBENZAPRINE HCL 10 MG PO TABS
10.0000 mg | ORAL_TABLET | Freq: Once | ORAL | Status: AC
  Administered 2020-06-05: 10 mg via ORAL
  Filled 2020-06-05: qty 1

## 2020-06-05 NOTE — ED Triage Notes (Addendum)
Pt reports chronic left ankle pain d/t a work related injury last year. Pt reports hip pain from putting all of his weight on his right side. While standing at a football game this weekend his right leg gave out causing him to fall landing on his right leg. States his right foot was twisted under him and pt unable to put weight on his left leg d/t left knee pain also from the fall. Pt reports numbness to BLE after the fall which ha subsided. Pt denies taking his HTN meds this am  Pt also requesting to be evaluated for DM.

## 2020-06-05 NOTE — ED Provider Notes (Signed)
[facility] EMERGENCY DEPARTMENT Provider Note   CSN: [id number] Arrival date & time: 06/05/20  1214     History Chief Complaint  Patient presents with  . Hip Pain  . Leg Pain    MARON STANZIONE is a 49 y.o. male.  Patient is a 49 year old male with a history of obesity, hypertension who presents today with persistent leg weakness.  Patient reports that 1 year ago he injured his ankle at work and had to have plates and screws placed in the left ankle.  He reports since that time he has started walking different and will have to use a cane sometimes to balance himself.  He reports over the last 2 months he has had more stiffness in his upper legs bilaterally that he reports that he has to stretch regularly but then they feel like they tighten back up.  He has pain in the right leg that radiates down the back of his leg and sometimes goes to his right great toe.  He denies any pain in his back.  He reports now for months he has been using a cane to walk just because he is worried that he could fall.  On Sunday he was at a football game and stood for a prolonged period of time.  He was then coming down the ramp to go home when his right leg gave out causing him to fall to the ground.  He reports since Sunday he has not been able to ambulate with his cane or stand without assistance.  He reports initially on Sunday he was having numbness in bilateral lower extremities which has improved but he continues to have weakness in his thigh muscles which he had noticed had been present prior to the fall but now seem to be much worse.  He also notes that since the fall his right foot has been swollen and is mildly tender.  He denies any fever, urinary retention/incontinence, bowel incontinence.  He has been taking ibuprofen but denies any other medications.  He does take blood pressure medication but has not taken it today.  He seems to be more stiff in the morning when he gets up and prior to  the fall reports that after he got moving it seemed to get better until he stopped.  Since the fall he now sits in a chair in his house and scoots around because he is not strong enough to stand and walk.  The history is provided by the patient.  Hip Pain  Leg Pain      Past Medical History:  Diagnosis Date  . Anxiety   . Hypertension     There are no problems to display for this patient.   Past Surgical History:  Procedure Laterality Date  . NO PAST SURGERIES    . ORIF ANKLE FRACTURE Left 11/30/2018   Procedure: OPEN REDUCTION INTERNAL FIXATION (ORIF) left ankle lateral malleolar fracture;  Surgeon: Toni Arthurs, MD;  Location: [facility];  Service: Orthopedics;  Laterality: Left;        No family history on file.  Social History   Tobacco Use  . Smoking status: Never Smoker  . Smokeless tobacco: Never Used  Vaping Use  . Vaping Use: Never used  Substance Use Topics  . Alcohol use: Not Currently  . Drug use: Never    Home Medications Prior to Admission medications   Medication Sig Start Date End Date Taking? Authorizing Provider  acetaminophen (TYLENOL) 500  MG tablet Take 1,000 mg by mouth every 6 (six) hours as needed for moderate pain (as needed for pain).    [provider]  amLODipine (NORVASC) 10 MG tablet Take 10 mg by mouth daily.    Alver Fisher, RN  aspirin EC 81 MG tablet Take 1 tablet (81 mg total) by mouth 2 (two) times daily. 11/30/18   Jacinta Shoe, PA-C  docusate sodium (COLACE) 100 MG capsule Take 1 capsule (100 mg total) by mouth 2 (two) times daily. While taking narcotic pain medicine. 11/30/18   Jacinta Shoe, PA-C  hydrochlorothiazide (MICROZIDE) 12.5 MG capsule Take 12.5 mg by mouth daily.    Alver Fisher, RN  senna (SENOKOT) 8.6 MG TABS tablet Take 2 tablets (17.2 mg total) by mouth 2 (two) times daily. 11/30/18   Jacinta Shoe, PA-C    Allergies    Lisinopril  Review of Systems   Review of  Systems  All other systems reviewed and are negative.   Physical Exam Updated Vital Signs BP (!) 201/85 (BP Location: Right Arm)   Pulse 60   Temp 99 F (37.2 C) (Oral)   Resp 19   SpO2 92%   Physical Exam Vitals and nursing note reviewed.  Constitutional:      General: He is not in acute distress.    Appearance: Normal appearance. He is well-developed. He is obese.  HENT:     Head: Normocephalic and atraumatic.  Eyes:     Conjunctiva/sclera: Conjunctivae normal.     Pupils: Pupils are equal, round, and reactive to light.  Cardiovascular:     Rate and Rhythm: Normal rate and regular rhythm.     Heart sounds: No murmur heard.   Pulmonary:     Effort: Pulmonary effort is normal. No respiratory distress.     Breath sounds: Normal breath sounds. No wheezing or rales.  Abdominal:     General: There is no distension.     Palpations: Abdomen is soft.     Tenderness: There is no abdominal tenderness. There is no guarding or rebound.  Musculoskeletal:        General: Normal range of motion.     Cervical back: Normal range of motion and neck supple.     Lumbar back: Normal.     Right knee: Normal.     Left knee: Swelling and bony tenderness present. Normal range of motion.     Right foot: Swelling, tenderness and bony tenderness present.       Feet:  Skin:    General: Skin is warm and dry.     Findings: No erythema or rash.  Neurological:     Mental Status: He is alert and oriented to person, place, and time.     Motor: Weakness present.     Comments: 4/5 strength in bilateral hip flexors R>L.  5/5 strength in foot plantar and dorsi flexion bilaterally.  5/5 hamstring strength.  Pt is unsteady with standing and needs to lean against the bed and weakness with standing.  Sensation intact  Psychiatric:        Mood and Affect: Mood normal.        Behavior: Behavior normal.        Thought Content: Thought content normal.     ED Results / Procedures / Treatments   Labs (all  labs ordered are listed, but only abnormal results are displayed) Labs Reviewed  RESPIRATORY PANEL BY RT PCR (FLU A&B, COVID)  CBG MONITORING,  ED    EKG None  Radiology MR THORACIC SPINE WO CONTRAST  Result Date: 06/05/2020 CLINICAL DATA:  Initial evaluation for motor neuron disease. EXAM: MRI THORACIC SPINE WITHOUT CONTRAST TECHNIQUE: Multiplanar, multisequence MR imaging of the thoracic spine was performed. No intravenous contrast was administered. COMPARISON:  Previous MRI of the lumbar spine from the same day. FINDINGS: Alignment:  Examination degraded by motion artifact. Normal alignment with preservation of the normal thoracic kyphosis. No listhesis. Vertebrae: Vertebral body height maintained without acute or chronic fracture. Bone marrow signal intensity within normal limits. No worrisome osseous lesions. No abnormal marrow edema. Cord: Patchy T2/STIR signal abnormality seen within the central/right aspect of the distal thoracic cord at the level of T11-12, which could reflect edema and/or myelomalacia (series 4, image 9). Finding corresponds with abnormality on prior lumbar spine MRI. Finding felt to be related to compression/stenosis at this level. Otherwise, signal intensity within the thoracic spinal cord is grossly within normal limits on this motion degraded exam. No other appreciable or convincing cord signal abnormality. Paraspinal and other soft tissues: Unremarkable. Disc levels: T1-2: Negative interspace. Prominent ligamentum flavum hypertrophy flattens and indents the posterior thecal sac. Mild spinal stenosis without frank cord impingement. Foramina remain patent. T2-3: Negative interspace. Mild facet hypertrophy. No significant spinal stenosis. Mild left foraminal narrowing. Right neural foramina remains patent. T3-4: Mild disc bulge, slightly eccentric to the left. Moderate left worse than right facet and ligament flavum hypertrophy. No significant spinal stenosis. Severe left  foraminal narrowing (series 3, image 14). Right neural foramina remains patent. T4-5: Negative interspace. Mild to moderate right with mild left facet hypertrophy. No spinal stenosis. Foramina remain patent. T5-6: Disc desiccation without disc bulge. Mild to moderate facet hypertrophy, slightly worse on the left. No spinal stenosis. Foramina remain patent. T6-7: Negative interspace. Mild facet hypertrophy. No canal or foraminal stenosis. T7-8: Negative interspace. Mild facet hypertrophy, greater on the right. No canal or foraminal stenosis. T8-9: Small left subarticular disc protrusion with slight superior migration (series 3, image 11). Secondary flattening of the left ventral thecal sac with mild flattening of the left ventral cord (series 6, image 33). Mild right-sided facet hypertrophy. No significant spinal stenosis. Foramina remain patent. T9-10: Right subarticular to foraminal disc protrusion with slight superior migration (series 6, image 37). Flattening and indentation of the right ventral cord with minimal flattening of the right hemi cord. Mild facet hypertrophy. No significant spinal stenosis. Mild right foraminal narrowing. Left neural foramen remains widely patent. T10-11: Mild disc bulge. Moderate bilateral facet hypertrophy. Borderline mild spinal stenosis. Moderate right foraminal narrowing. No significant left foraminal stenosis. T11-12: Diffuse disc bulge, eccentric to the left. Moderate bilateral facet hypertrophy. Resultant moderate spinal stenosis with cord flattening. Associated patchy cord signal changes as above, which could reflect edema and/or myelomalacia (series 6, image 45). Mild left foraminal narrowing. No significant right foraminal stenosis. T12-L1: Negative interspace. Mild facet hypertrophy. No canal or foraminal stenosis. IMPRESSION: 1. Technically limited exam due to motion artifact. 2. Multifactorial degenerative changes at T11-12 with resultant moderate spinal stenosis and  cord flattening. Patchy signal abnormality within the central and right cord at this level could reflect edema and/or myelomalacia. Finding corresponds with abnormality on prior lumbar spine MRI. 3. Additional multilevel degenerative spondylosis as detailed above. No other high-grade spinal stenosis. Multilevel foraminal narrowing as above, most notable at T3-4 on the right. Electronically Signed   By: Rise Mu M.D.   On: 06/05/2020 21:28   MR LUMBAR SPINE WO CONTRAST  Result Date: 06/05/2020 CLINICAL DATA:  Motor neuron disease. EXAM: MRI LUMBAR SPINE WITHOUT CONTRAST TECHNIQUE: Multiplanar, multisequence MR imaging of the lumbar spine was performed. No intravenous contrast was administered. COMPARISON:  Motor neuron disease. FINDINGS: Segmentation: For the purposes of this dictation, five lumbar vertebrae are assumed and the caudal most well-formed intervertebral disc is designated L5-S1. Alignment:  No significant spondylolisthesis. Vertebrae: Vertebral body height is maintained. Mild marrow edema along the bilateral L4-L5 facet joints and within the left L5 articular pillar, likely degenerative. Conus medullaris and cauda equina: Conus extends to the L1-L2 level. Spinal cord impingement and possible spinal cord signal abnormality at the T11-T12 level as described below. Paraspinal and other soft tissues: No abnormality identified within included portions of the abdomen/retroperitoneum. Paraspinal soft tissues within normal limits. Disc levels: Mild multilevel disc degeneration greatest at T11-T12, L3-L4 and L5-S1. Congenitally narrow lumbar and visualized lower thoracic spine on the basis of short pedicles. T11-T12: This level is imaged sagittally. Disc bulge. Superimposed broad-based central to left foraminal disc protrusion. Facet arthrosis/ligamentum flavum hypertrophy. Apparent severe spinal canal stenosis with spinal cord impingement. Suggestion of T2 hyperintense signal abnormality within  the spinal cord at this level, and this may reflect focal edema or myelomalacia (series 5, image 7). Bilateral neural foraminal narrowing (mild right, moderate left). T12-L1: No significant disc herniation. Moderate facet arthrosis with ligamentum flavum hypertrophy. No significant degenerative canal or foraminal stenosis. L1-L2: No significant disc herniation. Moderate facet arthrosis. No significant degenerative canal stenosis. Mild left neural foraminal narrowing. L2-L3: No significant disc herniation. Mild facet arthrosis/ligamentum flavum hypertrophy. No significant degenerative canal stenosis. Mild bilateral neural foraminal narrowing. L3-L4: Mild disc bulge with endplate spurring. Moderate facet arthrosis with ligamentum flavum hypertrophy (greater on the right). Mild right subarticular narrowing without nerve root impingement. Central canal patent. Bilateral neural foraminal narrowing (moderate right, mild left). L4-L5: Mild disc bulge with endplate spurring. Advanced facet arthrosis with ligamentum flavum hypertrophy. Mild bilateral subarticular and central canal stenosis without frank nerve root impingement. Bilateral neural foraminal narrowing (moderate right, moderate/severe left). L5-S1: Disc bulge with endplate spurring. Superimposed shallow left center/subarticular disc protrusion at site of posterior annular fissure. Moderate facet arthrosis. The disc protrusion contributes to severe left subarticular stenosis with encroachment upon the descending left S1 nerve root (series 8, image 30). Mild right subarticular narrowing with slight crowding of the descending right S1 nerve root. Moderate central canal stenosis. Moderate/severe bilateral neural foraminal narrowing. Impression #1 below was called by telephone at the time of interpretation on 06/05/2020 at 6:34 pm to provider [facility] , who verbally acknowledged these results. IMPRESSION: The T11-T12 level is imaged in the sagittal plane only.  Spondylosis at this level with multifactorial apparent severe spinal canal stenosis and spinal cord impingement. Possible T2 hyperintense signal abnormality within the spinal cord at this level, which may reflect focal edema and/or myelomalacia. Dedicated MRI of the thoracic spine is recommended for further evaluation. Lumbar spondylosis as outlined with findings most notably as follows. At L5-S1, a left center/subarticular disc protrusion contributes to severe left subarticular stenosis, encroaching upon the descending left S1 nerve root. Multifactorial mild right subarticular and moderate central canal stenosis, as well as moderate/severe bilateral neural foraminal narrowing at this level. At L4-L5, advanced facet arthrosis contributes to multifactorial bilateral neural foraminal narrowing (moderate right, moderate/severe left). Mild bilateral subarticular and central canal stenosis without nerve root impingement also present at this level. No more than mild spinal canal narrowing at the remaining levels. Additional sites of mild  and moderate neural foraminal narrowing as described. Electronically Signed   By: Jackey Loge DO   On: 06/05/2020 18:36   DG Knee Complete 4 Views Left  Result Date: 06/05/2020 CLINICAL DATA:  Left knee weakness and instability after fall 3 days ago EXAM: LEFT KNEE - COMPLETE 4+ VIEW COMPARISON:  A 13 12 FINDINGS: Frontal, bilateral oblique, lateral views of the left knee are obtained. No fracture, subluxation, or dislocation. Mild joint space narrowing in the medial and lateral compartments. Chronic hypertrophic changes of the tibial tuberosity. Prominent enthesopathic changes of the patella, stable. Trace joint effusion. Soft tissues are unremarkable. IMPRESSION: 1. Mild osteoarthritis. 2. Trace joint effusion. 3. No acute fracture. Electronically Signed   By: Sharlet Salina M.D.   On: 06/05/2020 16:35   DG Foot Complete Right  Result Date: 06/05/2020 CLINICAL DATA:  Injured 2  days ago.  Pain. EXAM: RIGHT FOOT COMPLETE - 3+ VIEW COMPARISON:  None. FINDINGS: Soft tissue swelling of the forefoot. Fractures at the distal metaphyseal levels of the second, third and fourth metatarsals. Ordinary degenerative change at the great toe MTP joint. IMPRESSION: Fractures at the distal metaphyseal levels of the second, third and 4th metatarsals. Soft tissue swelling. Ordinary degenerative change at the great toe MTP joint. Electronically Signed   By: Paulina Fusi M.D.   On: 06/05/2020 14:00    Procedures Procedures (including critical care time)  Medications Ordered in ED Medications  cyclobenzaprine (FLEXERIL) tablet 10 mg (has no administration in time range)    ED Course  I have reviewed the triage vital signs and the nursing notes.  Pertinent labs & imaging results that were available during my care of the patient were reviewed by me and considered in my medical decision making (see chart for details).    MDM Rules/Calculators/A&P                          49 year old male presenting today with difficulty walking and weakness in his legs.  On exam patient does have weakness in his hip flexors as well as difficulty with weakness in standing.  However hamstring and calf muscles seem to be intact.  Patient was having issues prior to his fall on Sunday but they have worsened since that time.  Concern for possible cord compression versus radiculopathy.  He is not displaying any urinary or bowel issues at this time.  However the distribution of pain he describes on the right is concerning for L5.  Patient is left knee image shows mild osteoarthritis with trace joint effusion but no other acute findings.  Right foot with fractures of the distal metaphyseal levels of the second third and fourth metatarsals with soft tissue swelling.  Due to patient's significant weakness and difficulty even standing and inability to walk and concern for possible cord compression we will do MRI of the  lumbar spine for further evaluation.  Patient is not having significant pain at this time but does complain of intermittent muscle spasms and was given Flexeril.  He also request to be checked for diabetes as he does follow with a PCP but has not seen them yet for his physical and was concerned maybe that is what was causing his intermittent numbness.  No numbness at this time.  6:49 PM Patient's lumbar MRI shows significant arthritis with moderate central canal stenosis at L5-S1 but no signs of spinal canal impingement at this level however at T11/T12 level there is evidence of severe  spinal canal stenosis and spinal cord impingement possible T2 hyperintense signal abnormality as well and radiology recommended a thoracic MRI.  9:32 PM T-spine MRI shows that this is a technically limited exam due to motion artifact but multifactorial degenerative changes at T11 and 12 with resultant moderate spinal stenosis and cord flattening.  There is patchy signaling within the central and right cord at this level which could reflect edema and/or myelomalacia.  No other high-grade stenosis is seen.  Consulted neurosurgery for further recommendations.  Patient placed in a short leg splint for metatarsal fractures and will need follow-up with orthopedics.  10:31 PM Pt will be admitted to [facility].  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

## 2020-06-05 NOTE — ED Notes (Signed)
Pt took his home dose of amlodipine.

## 2020-06-05 NOTE — H&P (Addendum)
Chief Complaint   Chief Complaint  Patient presents with   Hip Pain   Leg Pain    HPI   Consult requested by: Dr Anitra Lauth, EDP [facility] Reason for consult: Thoracic myelopathy  HPI: Randy Stewart is a 49 y.o. male with history of HTN who presented to the ED with several day history of bilateral proximal lower extremity weakness. History begins in 2019 when patient had a fall at work at which time he fractured his left ankle requiring operative repair. Since then, he has had difficulties with ambulation due to feeling like his RLE is going to give out, but he has been managing at home, still going to work, etc. Starting 2-3 months ago, he started to have pronounced right leg weakness, particular in his hip, which usually occurs after being on his feet for a long period of time.This past week, he had was at a football game that required a lot of standing. While ambulating his right leg gave out causing him to fall. He developed immediate weakness & N/T in BLE. Since then, he has been unable to ambulate due to weakness, N/T resolved after 2 hours. 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 I was called for further recommendations and came by for evaluation. His wife is present with him and assists with history. He has minimal pain in his right hip that he describes as an ache. Otherwise no other pain. Rates hip pain 1/10. He has not had any further N/T in his extremities. He did have a similar of transient BLE weakness and N/T after his fall at work 2 years ago. He has been essentially bed ridden at home/stationary due to weakness. He does endorse bilateral hand numbness at times, but not consistent. No neck pain. No bowel/bladder dysfunction.  Patient also noted to have Fractures at the distal metaphyseal levels of the second, third and 4th metatarsals. Treated  nonoperatively.  There are no problems to display for this patient.   PMH: Past Medical History:  Diagnosis Date   Anxiety    Hypertension     PSH: Past Surgical History:  Procedure Laterality Date   NO PAST SURGERIES     ORIF ANKLE FRACTURE Left 11/30/2018   Procedure: OPEN REDUCTION INTERNAL FIXATION (ORIF) left ankle lateral malleolar fracture;  Surgeon: Toni Arthurs, MD;  Location: [facility];  Service: Orthopedics;  Laterality: Left;     (Not in a hospital admission)   SH: Social History   Tobacco Use   Smoking status: Never Smoker   Smokeless tobacco: Never Used  Vaping Use   Vaping Use: Never used  Substance Use Topics   Alcohol use: Not Currently   Drug use: Never    MEDS: Prior to Admission medications   Medication Sig Start Date End Date Taking? Authorizing Provider  acetaminophen (TYLENOL) 500 MG tablet Take 1,000 mg by mouth every 6 (six) hours as needed for moderate pain (as needed for pain).    [provider]  amLODipine (NORVASC) 10 MG tablet Take 10 mg by mouth daily.    Alver Fisher, RN  aspirin EC 81 MG tablet Take 1 tablet (81 mg total) by mouth 2 (two) times daily. 11/30/18   Jacinta Shoe, PA-C  docusate sodium (COLACE) 100 MG capsule Take 1 capsule (100 mg total) by mouth 2 (two) times daily. While taking narcotic pain  medicine. 11/30/18   Jacinta Shoe, PA-C  hydrochlorothiazide (MICROZIDE) 12.5 MG capsule Take 12.5 mg by mouth daily.    Alver Fisher, RN  senna (SENOKOT) 8.6 MG TABS tablet Take 2 tablets (17.2 mg total) by mouth 2 (two) times daily. 11/30/18   Jacinta Shoe, PA-C    ALLERGY: Allergies  Allergen Reactions   Lisinopril     Social History   Tobacco Use   Smoking status: Never Smoker   Smokeless tobacco: Never Used  Substance Use Topics   Alcohol use: Not Currently     No family history on file.   ROS   Review of Systems  All other systems reviewed and  are negative.   Exam   Vitals:   06/05/20 1237 06/05/20 2220  BP: (!) 201/85 (!) 187/76  Pulse: 60 62  Resp: 19 18  Temp: 99 F (37.2 C) 99.1 F (37.3 C)  SpO2: 92% 99%   General appearance: WDWN, NAD Eyes: No scleral injection Cardiovascular: Regular rate and rhythm without murmurs, rubs, gallops. No edema or variciosities. Distal pulses normal. Pulmonary: Effort normal, non-labored breathing Musculoskeletal:     Muscle tone upper extremities: Normal    Muscle tone lower extremities: Normal    Motor exam: Upper Extremities Deltoid Bicep Tricep Grip  Right 5/5 5/5 5/5 5/5  Left 5/5 5/5 5/5 5/5   Lower Extremity IP Quad PF DF EHL  Right 2+/5 4/5 5/5 5/5 5/5  Left 3/5 4+/5 5/5 5/5 5/5   Neurological Mental Status:    - Patient is awake, alert, oriented to person, place, month, year, and situation    - Patient is able to give a clear and coherent history.    - No signs of aphasia or neglect Cranial Nerves    - II: Visual Fields are full. PERRL    - III/IV/VI: EOMI without ptosis or diploplia.     - V: Facial sensation is grossly normal    - VII: Facial movement is symmetric.     - VIII: hearing is intact to voice    - X: Uvula elevates symmetrically    - XI: Shoulder shrug is symmetric.    - XII: tongue is midline without atrophy or fasciculations.  Sensory: Sensation grossly intact to LT Deep Tendon Reflexes    - 3+ and symmetric in the patellae   Results - Imaging/Labs   Results for orders placed or performed during the hospital encounter of 06/05/20 (from the past 48 hour(s))  POC CBG, ED     Status: None   Collection Time: 06/05/20  5:19 PM  Result Value Ref Range   Glucose-Capillary 81 70 - 99 mg/dL    Comment: Glucose reference range applies only to samples taken after fasting for at least 8 hours.   Comment 1 Notify RN    Comment 2 Document in Chart     MR THORACIC SPINE WO CONTRAST  Result Date: 06/05/2020 CLINICAL DATA:  Initial evaluation for  motor neuron disease. EXAM: MRI THORACIC SPINE WITHOUT CONTRAST TECHNIQUE: Multiplanar, multisequence MR imaging of the thoracic spine was performed. No intravenous contrast was administered. COMPARISON:  Previous MRI of the lumbar spine from the same day. FINDINGS: Alignment:  Examination degraded by motion artifact. Normal alignment with preservation of the normal thoracic kyphosis. No listhesis. Vertebrae: Vertebral body height maintained without acute or chronic fracture. Bone marrow signal intensity within normal limits. No worrisome osseous lesions. No abnormal marrow edema. Cord: Patchy T2/STIR signal abnormality seen within  the central/right aspect of the distal thoracic cord at the level of T11-12, which could reflect edema and/or myelomalacia (series 4, image 9). Finding corresponds with abnormality on prior lumbar spine MRI. Finding felt to be related to compression/stenosis at this level. Otherwise, signal intensity within the thoracic spinal cord is grossly within normal limits on this motion degraded exam. No other appreciable or convincing cord signal abnormality. Paraspinal and other soft tissues: Unremarkable. Disc levels: T1-2: Negative interspace. Prominent ligamentum flavum hypertrophy flattens and indents the posterior thecal sac. Mild spinal stenosis without frank cord impingement. Foramina remain patent. T2-3: Negative interspace. Mild facet hypertrophy. No significant spinal stenosis. Mild left foraminal narrowing. Right neural foramina remains patent. T3-4: Mild disc bulge, slightly eccentric to the left. Moderate left worse than right facet and ligament flavum hypertrophy. No significant spinal stenosis. Severe left foraminal narrowing (series 3, image 14). Right neural foramina remains patent. T4-5: Negative interspace. Mild to moderate right with mild left facet hypertrophy. No spinal stenosis. Foramina remain patent. T5-6: Disc desiccation without disc bulge. Mild to moderate facet  hypertrophy, slightly worse on the left. No spinal stenosis. Foramina remain patent. T6-7: Negative interspace. Mild facet hypertrophy. No canal or foraminal stenosis. T7-8: Negative interspace. Mild facet hypertrophy, greater on the right. No canal or foraminal stenosis. T8-9: Small left subarticular disc protrusion with slight superior migration (series 3, image 11). Secondary flattening of the left ventral thecal sac with mild flattening of the left ventral cord (series 6, image 33). Mild right-sided facet hypertrophy. No significant spinal stenosis. Foramina remain patent. T9-10: Right subarticular to foraminal disc protrusion with slight superior migration (series 6, image 37). Flattening and indentation of the right ventral cord with minimal flattening of the right hemi cord. Mild facet hypertrophy. No significant spinal stenosis. Mild right foraminal narrowing. Left neural foramen remains widely patent. T10-11: Mild disc bulge. Moderate bilateral facet hypertrophy. Borderline mild spinal stenosis. Moderate right foraminal narrowing. No significant left foraminal stenosis. T11-12: Diffuse disc bulge, eccentric to the left. Moderate bilateral facet hypertrophy. Resultant moderate spinal stenosis with cord flattening. Associated patchy cord signal changes as above, which could reflect edema and/or myelomalacia (series 6, image 45). Mild left foraminal narrowing. No significant right foraminal stenosis. T12-L1: Negative interspace. Mild facet hypertrophy. No canal or foraminal stenosis. IMPRESSION: 1. Technically limited exam due to motion artifact. 2. Multifactorial degenerative changes at T11-12 with resultant moderate spinal stenosis and cord flattening. Patchy signal abnormality within the central and right cord at this level could reflect edema and/or myelomalacia. Finding corresponds with abnormality on prior lumbar spine MRI. 3. Additional multilevel degenerative spondylosis as detailed above. No other  high-grade spinal stenosis. Multilevel foraminal narrowing as above, most notable at T3-4 on the right. Electronically Signed   By: Rise Mu M.D.   On: 06/05/2020 21:28   MR LUMBAR SPINE WO CONTRAST  Result Date: 06/05/2020 CLINICAL DATA:  Motor neuron disease. EXAM: MRI LUMBAR SPINE WITHOUT CONTRAST TECHNIQUE: Multiplanar, multisequence MR imaging of the lumbar spine was performed. No intravenous contrast was administered. COMPARISON:  Motor neuron disease. FINDINGS: Segmentation: For the purposes of this dictation, five lumbar vertebrae are assumed and the caudal most well-formed intervertebral disc is designated L5-S1. Alignment:  No significant spondylolisthesis. Vertebrae: Vertebral body height is maintained. Mild marrow edema along the bilateral L4-L5 facet joints and within the left L5 articular pillar, likely degenerative. Conus medullaris and cauda equina: Conus extends to the L1-L2 level. Spinal cord impingement and possible spinal cord signal abnormality at the T11-T12  level as described below. Paraspinal and other soft tissues: No abnormality identified within included portions of the abdomen/retroperitoneum. Paraspinal soft tissues within normal limits. Disc levels: Mild multilevel disc degeneration greatest at T11-T12, L3-L4 and L5-S1. Congenitally narrow lumbar and visualized lower thoracic spine on the basis of short pedicles. T11-T12: This level is imaged sagittally. Disc bulge. Superimposed broad-based central to left foraminal disc protrusion. Facet arthrosis/ligamentum flavum hypertrophy. Apparent severe spinal canal stenosis with spinal cord impingement. Suggestion of T2 hyperintense signal abnormality within the spinal cord at this level, and this may reflect focal edema or myelomalacia (series 5, image 7). Bilateral neural foraminal narrowing (mild right, moderate left). T12-L1: No significant disc herniation. Moderate facet arthrosis with ligamentum flavum hypertrophy. No  significant degenerative canal or foraminal stenosis. L1-L2: No significant disc herniation. Moderate facet arthrosis. No significant degenerative canal stenosis. Mild left neural foraminal narrowing. L2-L3: No significant disc herniation. Mild facet arthrosis/ligamentum flavum hypertrophy. No significant degenerative canal stenosis. Mild bilateral neural foraminal narrowing. L3-L4: Mild disc bulge with endplate spurring. Moderate facet arthrosis with ligamentum flavum hypertrophy (greater on the right). Mild right subarticular narrowing without nerve root impingement. Central canal patent. Bilateral neural foraminal narrowing (moderate right, mild left). L4-L5: Mild disc bulge with endplate spurring. Advanced facet arthrosis with ligamentum flavum hypertrophy. Mild bilateral subarticular and central canal stenosis without frank nerve root impingement. Bilateral neural foraminal narrowing (moderate right, moderate/severe left). L5-S1: Disc bulge with endplate spurring. Superimposed shallow left center/subarticular disc protrusion at site of posterior annular fissure. Moderate facet arthrosis. The disc protrusion contributes to severe left subarticular stenosis with encroachment upon the descending left S1 nerve root (series 8, image 30). Mild right subarticular narrowing with slight crowding of the descending right S1 nerve root. Moderate central canal stenosis. Moderate/severe bilateral neural foraminal narrowing. Impression #1 below was called by telephone at the time of interpretation on 06/05/2020 at 6:34 pm to provider [facility] , who verbally acknowledged these results. IMPRESSION: The T11-T12 level is imaged in the sagittal plane only. Spondylosis at this level with multifactorial apparent severe spinal canal stenosis and spinal cord impingement. Possible T2 hyperintense signal abnormality within the spinal cord at this level, which may reflect focal edema and/or myelomalacia. Dedicated MRI of the  thoracic spine is recommended for further evaluation. Lumbar spondylosis as outlined with findings most notably as follows. At L5-S1, a left center/subarticular disc protrusion contributes to severe left subarticular stenosis, encroaching upon the descending left S1 nerve root. Multifactorial mild right subarticular and moderate central canal stenosis, as well as moderate/severe bilateral neural foraminal narrowing at this level. At L4-L5, advanced facet arthrosis contributes to multifactorial bilateral neural foraminal narrowing (moderate right, moderate/severe left). Mild bilateral subarticular and central canal stenosis without nerve root impingement also present at this level. No more than mild spinal canal narrowing at the remaining levels. Additional sites of mild and moderate neural foraminal narrowing as described. Electronically Signed   By: Jackey Loge DO   On: 06/05/2020 18:36   DG Knee Complete 4 Views Left  Result Date: 06/05/2020 CLINICAL DATA:  Left knee weakness and instability after fall 3 days ago EXAM: LEFT KNEE - COMPLETE 4+ VIEW COMPARISON:  A 13 12 FINDINGS: Frontal, bilateral oblique, lateral views of the left knee are obtained. No fracture, subluxation, or dislocation. Mild joint space narrowing in the medial and lateral compartments. Chronic hypertrophic changes of the tibial tuberosity. Prominent enthesopathic changes of the patella, stable. Trace joint effusion. Soft tissues are unremarkable. IMPRESSION: 1. Mild osteoarthritis. 2.  Trace joint effusion. 3. No acute fracture. Electronically Signed   By: Sharlet SalinaMichael  Brown M.D.   On: 06/05/2020 16:35   DG Foot Complete Right  Result Date: 06/05/2020 CLINICAL DATA:  Injured 2 days ago.  Pain. EXAM: RIGHT FOOT COMPLETE - 3+ VIEW COMPARISON:  None. FINDINGS: Soft tissue swelling of the forefoot. Fractures at the distal metaphyseal levels of the second, third and fourth metatarsals. Ordinary degenerative change at the great toe MTP joint.  IMPRESSION: Fractures at the distal metaphyseal levels of the second, third and 4th metatarsals. Soft tissue swelling. Ordinary degenerative change at the great toe MTP joint. Electronically Signed   By: Paulina FusiMark  Shogry M.D.   On: 06/05/2020 14:00    Impression/Plan   49 y.o. 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 He does have other areas of degenerative changes throughout his T/L spine without high grade central canal stenosis. Given clinical presentation in combination with MRI findings, I believe patient needs a thoracic laminectomy for decompression. I had a long discussion with both the patient and his wife regarding the MRI findings as well as recommendations for surgery. Risks, benefits and alternatives were discussed. Patient and wife state understanding. - Admit to neuro-progressive unit - start decadron 4mg  q 6 hours  - Dr Conchita ParisNundkumar to follow up tomorrow am - NPO (starting midnight) until Dr Conchita ParisNundkumar evaluates the patient  Right metatarsal fracture: placed in splint by EDP. Will need outpatient f/u  Cindra PresumeVincent Yazmine Sorey, [facility]

## 2020-06-05 NOTE — ED Notes (Signed)
Patient transported to MRI 

## 2020-06-05 NOTE — Progress Notes (Signed)
Orthopedic Tech Progress Note Patient Details:  BRONSON BRESSMAN [birth year] [id number]  Ortho Devices Type of Ortho Device: Short leg splint Ortho Device/Splint Location: Right Lower Extremity Ortho Device/Splint Interventions: Ordered, Application   Post Interventions Patient Tolerated: Well Instructions Provided: Adjustment of device, Poper ambulation with device, Care of device   Delance Weide P Harle Stanford 06/05/2020, 10:40 PM

## 2020-06-06 ENCOUNTER — Other Ambulatory Visit: Payer: Self-pay | Admitting: Neurosurgery

## 2020-06-06 ENCOUNTER — Encounter (HOSPITAL_COMMUNITY): Payer: Self-pay | Admitting: Neurosurgery

## 2020-06-06 ENCOUNTER — Inpatient Hospital Stay (HOSPITAL_COMMUNITY): Payer: PRIVATE HEALTH INSURANCE

## 2020-06-06 ENCOUNTER — Inpatient Hospital Stay (HOSPITAL_COMMUNITY): Payer: PRIVATE HEALTH INSURANCE | Admitting: Certified Registered Nurse Anesthetist

## 2020-06-06 ENCOUNTER — Encounter (HOSPITAL_COMMUNITY)
Admission: EM | Disposition: A | Discharge status: Home / Self Care | Payer: Self-pay | Source: Home / Self Care | Attending: Neurosurgery

## 2020-06-06 HISTORY — PX: LUMBAR LAMINECTOMY/DECOMPRESSION MICRODISCECTOMY: SHX5026

## 2020-06-06 LAB — BASIC METABOLIC PANEL
Anion gap: 12 (ref 5–15)
BUN: 10 mg/dL (ref 6–20)
CO2: 24 mmol/L (ref 22–32)
Calcium: 9.5 mg/dL (ref 8.9–10.3)
Chloride: 103 mmol/L (ref 98–111)
Creatinine, Ser: 0.85 mg/dL (ref 0.61–1.24)
GFR, Estimated: >60 mL/min (ref >60)
Glucose, Bld: 112 mg/dL — ABNORMAL HIGH (ref 70–99)
Potassium: 3.6 mmol/L (ref 3.5–5.1)
Sodium: 139 mmol/L (ref 135–145)

## 2020-06-06 LAB — CBC
HCT: 48.8 % (ref 39.0–52.0)
Hemoglobin: 15.4 g/dL (ref 13.0–17.0)
MCH: 25.9 pg — ABNORMAL LOW (ref 26.0–34.0)
MCHC: 31.6 g/dL (ref 30.0–36.0)
MCV: 82.2 fL (ref 80.0–100.0)
Platelets: 236 10*3/uL (ref 150–400)
RBC: 5.94 MIL/uL — ABNORMAL HIGH (ref 4.22–5.81)
RDW: 14.2 % (ref 11.5–15.5)
WBC: 9.9 10*3/uL (ref 4.0–10.5)
nRBC: 0 % (ref 0.0–0.2)

## 2020-06-06 LAB — RESPIRATORY PANEL BY RT PCR (FLU A&B, COVID)
Influenza A by PCR: NEGATIVE
Influenza B by PCR: NEGATIVE
SARS Coronavirus 2 by RT PCR: NEGATIVE

## 2020-06-06 LAB — SURGICAL PCR SCREEN
MRSA, PCR: NEGATIVE
Staphylococcus aureus: POSITIVE — AB

## 2020-06-06 SURGERY — LUMBAR LAMINECTOMY/DECOMPRESSION MICRODISCECTOMY 1 LEVEL
Anesthesia: General | Site: Back

## 2020-06-06 MED ORDER — DEXMEDETOMIDINE (PRECEDEX) IN NS 20 MCG/5ML (4 MCG/ML) IV SYRINGE
PREFILLED_SYRINGE | INTRAVENOUS | Status: AC
  Filled 2020-06-06: qty 5

## 2020-06-06 MED ORDER — DEXAMETHASONE SODIUM PHOSPHATE 4 MG/ML IJ SOLN
4.0000 mg | Freq: Four times a day (QID) | INTRAMUSCULAR | Status: DC
  Administered 2020-06-06 – 2020-06-10 (×15): 4 mg via INTRAVENOUS
  Filled 2020-06-06 (×15): qty 1

## 2020-06-06 MED ORDER — HYDROMORPHONE HCL 1 MG/ML IJ SOLN: 0.5000 mg | INTRAMUSCULAR | Status: DC | PRN

## 2020-06-06 MED ORDER — ONDANSETRON HCL 4 MG/2ML IJ SOLN: 4.0000 mg | Freq: Four times a day (QID) | INTRAMUSCULAR | Status: DC | PRN

## 2020-06-06 MED ORDER — LACTATED RINGERS IV SOLN: INTRAVENOUS | Status: DC

## 2020-06-06 MED ORDER — KETOROLAC TROMETHAMINE 30 MG/ML IJ SOLN
INTRAMUSCULAR | Status: AC
  Filled 2020-06-06: qty 1

## 2020-06-06 MED ORDER — ACETAMINOPHEN 500 MG PO TABS
1000.0000 mg | ORAL_TABLET | Freq: Four times a day (QID) | ORAL | Status: AC
  Administered 2020-06-06 – 2020-06-07 (×4): 1000 mg via ORAL
  Filled 2020-06-06 (×4): qty 2

## 2020-06-06 MED ORDER — ROCURONIUM BROMIDE 10 MG/ML (PF) SYRINGE
PREFILLED_SYRINGE | INTRAVENOUS | Status: DC | PRN
  Administered 2020-06-06: 100 mg via INTRAVENOUS

## 2020-06-06 MED ORDER — FENTANYL CITRATE (PF) 250 MCG/5ML IJ SOLN
INTRAMUSCULAR | Status: DC | PRN
  Administered 2020-06-06: 50 ug via INTRAVENOUS
  Administered 2020-06-06: 150 ug via INTRAVENOUS
  Administered 2020-06-06: 50 ug via INTRAVENOUS

## 2020-06-06 MED ORDER — KETOROLAC TROMETHAMINE 30 MG/ML IJ SOLN
30.0000 mg | Freq: Once | INTRAMUSCULAR | Status: AC | PRN
  Administered 2020-06-06: 30 mg via INTRAVENOUS

## 2020-06-06 MED ORDER — BUPIVACAINE HCL (PF) 0.5 % IJ SOLN
INTRAMUSCULAR | Status: AC
  Filled 2020-06-06: qty 30

## 2020-06-06 MED ORDER — HYDROCHLOROTHIAZIDE 12.5 MG PO CAPS
12.5000 mg | ORAL_CAPSULE | Freq: Every day | ORAL | Status: DC
  Administered 2020-06-06 – 2020-06-11 (×5): 12.5 mg via ORAL
  Filled 2020-06-06 (×5): qty 1

## 2020-06-06 MED ORDER — OXYCODONE HCL 5 MG PO TABS: 5.0000 mg | ORAL_TABLET | Freq: Once | ORAL | Status: DC | PRN

## 2020-06-06 MED ORDER — HYDROMORPHONE HCL 1 MG/ML IJ SOLN
INTRAMUSCULAR | Status: AC
  Filled 2020-06-06: qty 1

## 2020-06-06 MED ORDER — ACETAMINOPHEN 325 MG PO TABS: 650.0000 mg | ORAL_TABLET | ORAL | Status: DC | PRN

## 2020-06-06 MED ORDER — PROPOFOL 10 MG/ML IV BOLUS
INTRAVENOUS | Status: DC | PRN
  Administered 2020-06-06: 200 mg via INTRAVENOUS

## 2020-06-06 MED ORDER — ROCURONIUM BROMIDE 10 MG/ML (PF) SYRINGE
PREFILLED_SYRINGE | INTRAVENOUS | Status: AC
  Filled 2020-06-06: qty 10

## 2020-06-06 MED ORDER — METHOCARBAMOL 1000 MG/10ML IJ SOLN: 500.0000 mg | Freq: Four times a day (QID) | INTRAVENOUS | Status: DC | PRN

## 2020-06-06 MED ORDER — THROMBIN 5000 UNITS EX SOLR
CUTANEOUS | Status: DC | PRN
  Administered 2020-06-06 (×2): 5000 [IU] via TOPICAL

## 2020-06-06 MED ORDER — ACETAMINOPHEN 500 MG PO TABS
1000.0000 mg | ORAL_TABLET | Freq: Once | ORAL | Status: AC
  Administered 2020-06-06: 1000 mg via ORAL

## 2020-06-06 MED ORDER — HEMOSTATIC AGENTS (NO CHARGE) OPTIME
TOPICAL | Status: DC | PRN
  Administered 2020-06-06 (×2): 1 via TOPICAL

## 2020-06-06 MED ORDER — SODIUM CHLORIDE 0.9% FLUSH
3.0000 mL | Freq: Two times a day (BID) | INTRAVENOUS | Status: DC
  Administered 2020-06-06 – 2020-06-11 (×7): 3 mL via INTRAVENOUS

## 2020-06-06 MED ORDER — CHLORHEXIDINE GLUCONATE 0.12 % MT SOLN
OROMUCOSAL | Status: AC
  Filled 2020-06-06: qty 15

## 2020-06-06 MED ORDER — OXYCODONE HCL 5 MG/5ML PO SOLN: 5.0000 mg | Freq: Once | ORAL | Status: DC | PRN

## 2020-06-06 MED ORDER — HYDROCODONE-ACETAMINOPHEN 5-325 MG PO TABS
1.0000 | ORAL_TABLET | ORAL | Status: DC | PRN
  Administered 2020-06-06 – 2020-06-11 (×2): 1 via ORAL
  Filled 2020-06-06 (×2): qty 1

## 2020-06-06 MED ORDER — LIDOCAINE 2% (20 MG/ML) 5 ML SYRINGE
INTRAMUSCULAR | Status: AC
  Filled 2020-06-06: qty 5

## 2020-06-06 MED ORDER — SENNOSIDES-DOCUSATE SODIUM 8.6-50 MG PO TABS: 1.0000 | ORAL_TABLET | Freq: Every evening | ORAL | Status: DC | PRN

## 2020-06-06 MED ORDER — DEXAMETHASONE SODIUM PHOSPHATE 10 MG/ML IJ SOLN
INTRAMUSCULAR | Status: AC
  Filled 2020-06-06: qty 1

## 2020-06-06 MED ORDER — AMLODIPINE BESYLATE 10 MG PO TABS
10.0000 mg | ORAL_TABLET | Freq: Every day | ORAL | Status: DC
  Administered 2020-06-06 – 2020-06-10 (×4): 10 mg via ORAL
  Filled 2020-06-06 (×3): qty 1
  Filled 2020-06-06: qty 2

## 2020-06-06 MED ORDER — SODIUM CHLORIDE 0.9 % IV SOLN: 250.0000 mL | INTRAVENOUS | Status: DC

## 2020-06-06 MED ORDER — LIDOCAINE HCL (CARDIAC) PF 100 MG/5ML IV SOSY
PREFILLED_SYRINGE | INTRAVENOUS | Status: DC | PRN
  Administered 2020-06-06: 60 mg via INTRAVENOUS

## 2020-06-06 MED ORDER — SODIUM CHLORIDE 0.9 % IV SOLN: INTRAVENOUS | Status: DC

## 2020-06-06 MED ORDER — SODIUM CHLORIDE 0.9% FLUSH: 3.0000 mL | INTRAVENOUS | Status: DC | PRN

## 2020-06-06 MED ORDER — DEXMEDETOMIDINE (PRECEDEX) IN NS 20 MCG/5ML (4 MCG/ML) IV SYRINGE
PREFILLED_SYRINGE | INTRAVENOUS | Status: DC | PRN
  Administered 2020-06-06: 8 ug via INTRAVENOUS

## 2020-06-06 MED ORDER — CHLORHEXIDINE GLUCONATE 0.12 % MT SOLN: 15.0000 mL | Freq: Once | OROMUCOSAL | Status: DC

## 2020-06-06 MED ORDER — ACETAMINOPHEN 500 MG PO TABS
1000.0000 mg | ORAL_TABLET | Freq: Four times a day (QID) | ORAL | Status: DC
  Filled 2020-06-06 (×2): qty 2

## 2020-06-06 MED ORDER — ONDANSETRON HCL 4 MG/2ML IJ SOLN
INTRAMUSCULAR | Status: AC
  Filled 2020-06-06: qty 2

## 2020-06-06 MED ORDER — ONDANSETRON HCL 4 MG PO TABS: 4.0000 mg | ORAL_TABLET | Freq: Four times a day (QID) | ORAL | Status: DC | PRN

## 2020-06-06 MED ORDER — GABAPENTIN 300 MG PO CAPS
300.0000 mg | ORAL_CAPSULE | Freq: Three times a day (TID) | ORAL | Status: DC
  Administered 2020-06-06 – 2020-06-11 (×13): 300 mg via ORAL
  Filled 2020-06-06 (×13): qty 1

## 2020-06-06 MED ORDER — METHOCARBAMOL 1000 MG/10ML IJ SOLN
500.0000 mg | Freq: Four times a day (QID) | INTRAVENOUS | Status: DC | PRN
  Filled 2020-06-06: qty 5

## 2020-06-06 MED ORDER — ONDANSETRON HCL 4 MG/2ML IJ SOLN
INTRAMUSCULAR | Status: DC | PRN
  Administered 2020-06-06: 4 mg via INTRAVENOUS

## 2020-06-06 MED ORDER — PHENOL 1.4 % MT LIQD
1.0000 | OROMUCOSAL | Status: DC | PRN
  Filled 2020-06-06: qty 177

## 2020-06-06 MED ORDER — FLEET ENEMA 7-19 GM/118ML RE ENEM: 1.0000 | ENEMA | Freq: Once | RECTAL | Status: DC | PRN

## 2020-06-06 MED ORDER — HYDROCODONE-ACETAMINOPHEN 5-325 MG PO TABS: 1.0000 | ORAL_TABLET | ORAL | Status: DC | PRN

## 2020-06-06 MED ORDER — ORAL CARE MOUTH RINSE: 15.0000 mL | Freq: Once | OROMUCOSAL | Status: DC

## 2020-06-06 MED ORDER — MENTHOL 3 MG MT LOZG: 1.0000 | LOZENGE | OROMUCOSAL | Status: DC | PRN

## 2020-06-06 MED ORDER — CEFAZOLIN SODIUM-DEXTROSE 2-4 GM/100ML-% IV SOLN
INTRAVENOUS | Status: AC
  Filled 2020-06-06: qty 100

## 2020-06-06 MED ORDER — DOCUSATE SODIUM 100 MG PO CAPS: 100.0000 mg | ORAL_CAPSULE | Freq: Two times a day (BID) | ORAL | Status: DC

## 2020-06-06 MED ORDER — CEFAZOLIN SODIUM-DEXTROSE 2-3 GM-%(50ML) IV SOLR
INTRAVENOUS | Status: DC | PRN
  Administered 2020-06-06: 2 g via INTRAVENOUS

## 2020-06-06 MED ORDER — THROMBIN 5000 UNITS EX SOLR
CUTANEOUS | Status: AC
  Filled 2020-06-06: qty 10000

## 2020-06-06 MED ORDER — ACETAMINOPHEN 650 MG RE SUPP: 650.0000 mg | RECTAL | Status: DC | PRN

## 2020-06-06 MED ORDER — SUGAMMADEX SODIUM 200 MG/2ML IV SOLN
INTRAVENOUS | Status: DC | PRN
  Administered 2020-06-06: 200 mg via INTRAVENOUS

## 2020-06-06 MED ORDER — OXYCODONE HCL 5 MG PO TABS
5.0000 mg | ORAL_TABLET | ORAL | Status: DC | PRN
  Administered 2020-06-06 – 2020-06-11 (×10): 10 mg via ORAL
  Filled 2020-06-06 (×10): qty 2

## 2020-06-06 MED ORDER — SODIUM CHLORIDE 0.9% FLUSH: 3.0000 mL | Freq: Two times a day (BID) | INTRAVENOUS | Status: DC

## 2020-06-06 MED ORDER — MENTHOL 3 MG MT LOZG
1.0000 | LOZENGE | OROMUCOSAL | Status: DC | PRN
  Filled 2020-06-06 (×2): qty 9

## 2020-06-06 MED ORDER — SENNA 8.6 MG PO TABS
1.0000 | ORAL_TABLET | Freq: Two times a day (BID) | ORAL | Status: DC
  Administered 2020-06-09 – 2020-06-11 (×4): 8.6 mg via ORAL
  Filled 2020-06-06 (×6): qty 1

## 2020-06-06 MED ORDER — LIDOCAINE-EPINEPHRINE 1 %-1:100000 IJ SOLN
INTRAMUSCULAR | Status: AC
  Filled 2020-06-06: qty 1

## 2020-06-06 MED ORDER — MIDAZOLAM HCL 5 MG/5ML IJ SOLN
INTRAMUSCULAR | Status: DC | PRN
  Administered 2020-06-06: 2 mg via INTRAVENOUS

## 2020-06-06 MED ORDER — DEXAMETHASONE SODIUM PHOSPHATE 10 MG/ML IJ SOLN
INTRAMUSCULAR | Status: DC | PRN
  Administered 2020-06-06: 10 mg via INTRAVENOUS

## 2020-06-06 MED ORDER — HYDROMORPHONE HCL 1 MG/ML IJ SOLN
0.5000 mg | INTRAMUSCULAR | Status: DC | PRN
  Administered 2020-06-08 – 2020-06-09 (×5): 1 mg via INTRAVENOUS
  Filled 2020-06-06 (×5): qty 1

## 2020-06-06 MED ORDER — BISACODYL 10 MG RE SUPP: 10.0000 mg | Freq: Every day | RECTAL | Status: DC | PRN

## 2020-06-06 MED ORDER — CEFAZOLIN SODIUM-DEXTROSE 2-4 GM/100ML-% IV SOLN
2.0000 g | Freq: Three times a day (TID) | INTRAVENOUS | Status: AC
  Administered 2020-06-06 – 2020-06-07 (×2): 2 g via INTRAVENOUS
  Filled 2020-06-06 (×2): qty 100

## 2020-06-06 MED ORDER — METHOCARBAMOL 500 MG PO TABS: 500.0000 mg | ORAL_TABLET | Freq: Four times a day (QID) | ORAL | Status: DC | PRN

## 2020-06-06 MED ORDER — METHOCARBAMOL 500 MG PO TABS
500.0000 mg | ORAL_TABLET | Freq: Four times a day (QID) | ORAL | Status: DC | PRN
  Administered 2020-06-07 – 2020-06-09 (×3): 500 mg via ORAL
  Filled 2020-06-06 (×2): qty 1

## 2020-06-06 MED ORDER — PROMETHAZINE HCL 25 MG/ML IJ SOLN: 6.2500 mg | INTRAMUSCULAR | Status: DC | PRN

## 2020-06-06 MED ORDER — LIDOCAINE-EPINEPHRINE 1 %-1:100000 IJ SOLN
INTRAMUSCULAR | Status: DC | PRN
  Administered 2020-06-06: 5 mL

## 2020-06-06 MED ORDER — FENTANYL CITRATE (PF) 250 MCG/5ML IJ SOLN
INTRAMUSCULAR | Status: AC
  Filled 2020-06-06: qty 5

## 2020-06-06 MED ORDER — PROPOFOL 10 MG/ML IV BOLUS
INTRAVENOUS | Status: AC
  Filled 2020-06-06: qty 40

## 2020-06-06 MED ORDER — METHYLPREDNISOLONE ACETATE 80 MG/ML IJ SUSP
INTRAMUSCULAR | Status: AC
  Filled 2020-06-06: qty 1

## 2020-06-06 MED ORDER — DOCUSATE SODIUM 100 MG PO CAPS
100.0000 mg | ORAL_CAPSULE | Freq: Two times a day (BID) | ORAL | Status: DC
  Administered 2020-06-07 – 2020-06-11 (×8): 100 mg via ORAL
  Filled 2020-06-06 (×9): qty 1

## 2020-06-06 MED ORDER — OXYCODONE HCL 5 MG PO TABS: 5.0000 mg | ORAL_TABLET | ORAL | Status: DC | PRN

## 2020-06-06 MED ORDER — PHENOL 1.4 % MT LIQD: 1.0000 | OROMUCOSAL | Status: DC | PRN

## 2020-06-06 MED ORDER — MIDAZOLAM HCL 2 MG/2ML IJ SOLN
INTRAMUSCULAR | Status: AC
  Filled 2020-06-06: qty 2

## 2020-06-06 MED ORDER — 0.9 % SODIUM CHLORIDE (POUR BTL) OPTIME
TOPICAL | Status: DC | PRN
  Administered 2020-06-06: 1000 mL

## 2020-06-06 MED ORDER — SENNA 8.6 MG PO TABS: 1.0000 | ORAL_TABLET | Freq: Two times a day (BID) | ORAL | Status: DC

## 2020-06-06 MED ORDER — PHENYLEPHRINE HCL-NACL 10-0.9 MG/250ML-% IV SOLN
INTRAVENOUS | Status: DC | PRN
  Administered 2020-06-06: 25 ug/min via INTRAVENOUS

## 2020-06-06 MED ORDER — LACTATED RINGERS IV SOLN: INTRAVENOUS | Status: DC | PRN

## 2020-06-06 MED ORDER — BUPIVACAINE HCL (PF) 0.5 % IJ SOLN
INTRAMUSCULAR | Status: DC | PRN
  Administered 2020-06-06: 5 mL

## 2020-06-06 MED ORDER — THROMBIN 5000 UNITS EX SOLR
CUTANEOUS | Status: AC
  Filled 2020-06-06: qty 5000

## 2020-06-06 MED ORDER — HYDROMORPHONE HCL 1 MG/ML IJ SOLN
0.2500 mg | INTRAMUSCULAR | Status: DC | PRN
  Administered 2020-06-06 (×2): 0.5 mg via INTRAVENOUS

## 2020-06-06 SURGICAL SUPPLY — 61 items
ADH SKN CLS APL DERMABOND .7 (GAUZE/BANDAGES/DRESSINGS) ×1
APL SKNCLS STERI-STRIP NONHPOA (GAUZE/BANDAGES/DRESSINGS) ×1
BAND INSRT 18 STRL LF DISP RB (MISCELLANEOUS) ×2
BAND RUBBER #18 3X1/16 STRL (MISCELLANEOUS) ×6 IMPLANT
BENZOIN TINCTURE PRP APPL 2/3 (GAUZE/BANDAGES/DRESSINGS) ×3 IMPLANT
BLADE CLIPPER SURG (BLADE) ×2 IMPLANT
BLADE SURG 11 STRL SS (BLADE) ×3 IMPLANT
BUR MATCHSTICK NEURO 3.0 LAGG (BURR) ×3 IMPLANT
BUR PRECISION FLUTE 5.0 (BURR) ×3 IMPLANT
CANISTER SUCT 3000ML PPV (MISCELLANEOUS) ×3 IMPLANT
CARTRIDGE OIL MAESTRO DRILL (MISCELLANEOUS) ×1 IMPLANT
CLOSURE WOUND 1/2 X4 (GAUZE/BANDAGES/DRESSINGS)
COVER WAND RF STERILE (DRAPES) ×3 IMPLANT
DECANTER SPIKE VIAL GLASS SM (MISCELLANEOUS) ×3 IMPLANT
DERMABOND ADVANCED (GAUZE/BANDAGES/DRESSINGS) ×2
DERMABOND ADVANCED .7 DNX12 (GAUZE/BANDAGES/DRESSINGS) ×1 IMPLANT
DIFFUSER DRILL AIR PNEUMATIC (MISCELLANEOUS) ×3 IMPLANT
DRAPE C-ARM 42X72 X-RAY (DRAPES) ×6 IMPLANT
DRAPE LAPAROTOMY 100X72X124 (DRAPES) ×3 IMPLANT
DRAPE MICROSCOPE LEICA (MISCELLANEOUS) ×3 IMPLANT
DRAPE SURG 17X23 STRL (DRAPES) ×3 IMPLANT
DRSG OPSITE POSTOP 3X4 (GAUZE/BANDAGES/DRESSINGS) ×3 IMPLANT
DRSG OPSITE POSTOP 4X6 (GAUZE/BANDAGES/DRESSINGS) ×2 IMPLANT
DURAPREP 26ML APPLICATOR (WOUND CARE) ×3 IMPLANT
ELECT REM PT RETURN 9FT ADLT (ELECTROSURGICAL) ×3
ELECTRODE REM PT RTRN 9FT ADLT (ELECTROSURGICAL) ×1 IMPLANT
GAUZE 4X4 16PLY RFD (DISPOSABLE) IMPLANT
GAUZE SPONGE 4X4 12PLY STRL (GAUZE/BANDAGES/DRESSINGS) IMPLANT
GLOVE BIO SURGEON STRL SZ7.5 (GLOVE) ×2 IMPLANT
GLOVE BIOGEL PI IND STRL 7.5 (GLOVE) ×2 IMPLANT
GLOVE BIOGEL PI INDICATOR 7.5 (GLOVE) ×6
GLOVE ECLIPSE 7.0 STRL STRAW (GLOVE) ×3 IMPLANT
GLOVE EXAM NITRILE XL STR (GLOVE) IMPLANT
GLOVE SURG SS PI 7.0 STRL IVOR (GLOVE) ×2 IMPLANT
GOWN STRL REUS W/ TWL LRG LVL3 (GOWN DISPOSABLE) ×2 IMPLANT
GOWN STRL REUS W/ TWL XL LVL3 (GOWN DISPOSABLE) IMPLANT
GOWN STRL REUS W/TWL 2XL LVL3 (GOWN DISPOSABLE) IMPLANT
GOWN STRL REUS W/TWL LRG LVL3 (GOWN DISPOSABLE) ×6
GOWN STRL REUS W/TWL XL LVL3 (GOWN DISPOSABLE) ×3
HEMOSTAT POWDER KIT SURGIFOAM (HEMOSTASIS) ×1 IMPLANT
KIT BASIN OR (CUSTOM PROCEDURE TRAY) ×3 IMPLANT
KIT TURNOVER KIT B (KITS) ×3 IMPLANT
NDL HYPO 18GX1.5 BLUNT FILL (NEEDLE) IMPLANT
NEEDLE HYPO 18GX1.5 BLUNT FILL (NEEDLE) IMPLANT
NEEDLE HYPO 22GX1.5 SAFETY (NEEDLE) ×3 IMPLANT
NEEDLE SPNL 18GX3.5 QUINCKE PK (NEEDLE) ×6 IMPLANT
NS IRRIG 1000ML POUR BTL (IV SOLUTION) ×3 IMPLANT
OIL CARTRIDGE MAESTRO DRILL (MISCELLANEOUS) ×3
PACK LAMINECTOMY NEURO (CUSTOM PROCEDURE TRAY) ×3 IMPLANT
PAD ARMBOARD 7.5X6 YLW CONV (MISCELLANEOUS) ×3 IMPLANT
SPONGE LAP 4X18 RFD (DISPOSABLE) IMPLANT
SPONGE SURGIFOAM ABS GEL SZ50 (HEMOSTASIS) ×3 IMPLANT
STRIP CLOSURE SKIN 1/2X4 (GAUZE/BANDAGES/DRESSINGS) IMPLANT
SUT VIC AB 0 CT1 18XCR BRD8 (SUTURE) ×1 IMPLANT
SUT VIC AB 0 CT1 8-18 (SUTURE) ×6
SUT VIC AB 2-0 CT1 18 (SUTURE) IMPLANT
SUT VICRYL 3-0 RB1 18 ABS (SUTURE) ×6 IMPLANT
SYR 3ML LL SCALE MARK (SYRINGE) IMPLANT
TOWEL GREEN STERILE (TOWEL DISPOSABLE) ×3 IMPLANT
TOWEL GREEN STERILE FF (TOWEL DISPOSABLE) ×3 IMPLANT
WATER STERILE IRR 1000ML POUR (IV SOLUTION) ×3 IMPLANT

## 2020-06-06 NOTE — Op Note (Signed)
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 T11-12 laminectomy with medial facetectomy for decompression of thecal sac 2. Use of intraoperative microscope for microdissection technique  SURGEON: Dr. Lisbeth Renshaw, MD  ASSISTANT: Cindra Presume, PA-C  ANESTHESIA: General Endotracheal  EBL: 150cc  SPECIMENS: None  DRAINS: None  COMPLICATIONS: None immediate  CONDITION: Stable to PCAU  HISTORY: Randy Stewart is a 49 y.o. male initially presenting to the emergency department with worsening of right greater than left lower extremity weakness and essentially inability to walk after a fall approximately 4 days ago.  History actually begins about 1-1/2 years ago when he suffered a fall at work.  In addition to a broken ankle, he noted unsteady gait since then, as well as "muscle tightness."  He also noted occasional weakness of the right leg sometimes feeling as though the leg would give out.  He did not have significant upper extremity symptoms with the exception of bilateral hand paresthesias which are occasional.  No weakness of the upper extremities or incoordination.  He did undergo MRI of the cervical, thoracic, and lumbar spine.  His thoracic spine MRI did demonstrate notable stenosis at T11-12 with signal change within the spinal cord.  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  As the thoracic lesion was felt to be symptomatic, we elected to address this level first, with treatment of the cervical stenosis at a later date.  The risks, benefits, and alternatives to surgery as well as the plan to treat the cervical stenosis later were all reviewed in detail with the patient and  his wife.  The expected postoperative course including the likelihood of rehab were also discussed.  After all her questions were answered informed consent was obtained and witnessed.  PROCEDURE IN DETAIL: After informed consent was obtained and witnessed, the patient was brought to the operating room. After induction of general anesthesia, the patient was positioned on the operative table in the prone position with all pressure points meticulously padded. The skin of the low back was then prepped and draped in the usual sterile fashion.  Under fluoroscopy, the correct levels were identified by percutaneous placement of spinal needles and marked out on the skin, and after timeout was conducted, the skin was infiltrated with local anesthetic. Skin incision was then made sharply and Bovie electrocautery was used to dissect the subcutaneous tissue until the lumbodorsal fascia was identified. The fascia was then incised using Bovie electrocautery and the lamina at the T11 and T12 levels was identified with intraoperative fluoroscopy by placement of a dissector within the interlaminar space.  Subperiosteal dissection was then carried out primarily over the T11 lamina to identify the pars interarticularis as well as the T11-12 facet complex.  Self-retaining retractor was then placed.  Using a high-speed drill, laminectomy was completed at T11. The ligamentum flavum was then identified and removed.  I then removed a portion of the top of the T12 lamina.  Attention was then turned to medial facetectomy at T11-12.  Utilizing the high-speed drill, I drilled away the medial portion of the inferior articulating process of T11 as well as the superior articulating process of T12.  The remainder of the medial facetectomy was completed with Kerrison punches.  The  medial portion of the facet complex especially the superior articulating process of T12 did appear to be overgrown and compressing the posterior lateral aspect of  the thecal sac bilaterally.  Once the medial facetectomy was completed, I was able to visualize the entire thecal sac from edge to edge.  I was easily able to pass a small ball-tipped dissector within the ventral epidural space just lateral to the thecal sac as well as out the T11-12 foramen indicating good decompression.  Hemostasis was then secured using a combination of morcellized Gelfoam and thrombin and bipolar electrocautery. The wound is irrigated with copious amounts of saline irrigation. Self-retaining retractor was then removed, and the wound is closed in layers using a combination of interrupted 0 Vicryl and 3-0 Vicryl stitches. The skin was closed using skin glue.  At the end of the case all sponge, needle, cottonoid, and instrument counts were correct. The patient was then transferred to the stretcher and taken to the postanesthesia care unit in stable hemodynamic condition.

## 2020-06-06 NOTE — Progress Notes (Signed)
  NEUROSURGERY PROGRESS NOTE   No issues overnight. History reviewed, pt initially suffered a fall about 1.1yrs ago with chronic gait instability and muscular "tightness" involving primarily RLE. Also noted feeling of right leg giving out while walking. Suffered another fall 3 days ago and has been unable to walk due to R>L leg weakness. No sensory changes, no changes in bladder function. Aside from some tingling in his hands, he does not report any weakness of UE or incoordination.  EXAM:  BP (!) 163/73   Pulse 64   Temp 99.1 F (37.3 C) (Oral)   Resp (!) 22   SpO2 93%   Awake, alert, oriented  Speech fluent, appropriate  CN grossly intact  5/5 BUE 3/5 proximal RLE, 4/5 Quad, 4+/5 DF, 4/5 EHL 4/5 proximal LLE, 4+/5 Quad, 5/5 DF/EHL Hyperreflexia bilateral patella (+) hoffman's bilaterally  IMAGING: MRI Tspine demonstrates stenosis at T11-12 related to ligamentous and facet arthropathy. There is associated intrinsic T2 signal change within the cord at this level.  IMPRESSION:  49 y.o. male with chronic symptoms c/w myelopathy significantly exacerbated by fall 3d ago. Imaging demonstrates stenosis with signal change at T11-12  PLAN: - With exam findings will check MRI C-spine - Plan on operative decompression at T11-12   I have reviewed the situation thus far with the patient and his wife at bedside. We discussed the stenosis with spinal cord injury as the likely cause of his symptoms. I recommended surgery for relief of stenosis to prevent further decline and provide best chance for recovery. We discussed risks of surgery including worsening spinal cord injury leading to worsening weakness/bladder dysfunction, numbness, bleeding, infection, and spinal fluid leak. We also discussed expected postoperative course and likely need for rehab. All their questions today were answered and they provided verbal consent to proceed as above.  Lisbeth Renshaw, MD [facility]

## 2020-06-06 NOTE — ED Notes (Signed)
Attending MD at bedside.

## 2020-06-06 NOTE — Transfer of Care (Signed)
Immediate Anesthesia Transfer of Care Note  Patient: Randy Stewart  Procedure(s) Performed: LUMBAR LAMINECTOMY THORACIC ELEVEN-TWELVE (N/A Back)  Patient Location: PACU  Anesthesia Type:General  Level of Consciousness: drowsy and patient cooperative  Airway & Oxygen Therapy: Patient Spontanous Breathing and Patient connected to face mask oxygen  Post-op Assessment: Report given to RN and Post -op Vital signs reviewed and stable  Post vital signs: Reviewed and stable  Last Vitals:  Vitals Value Taken Time  BP    Temp    Pulse    Resp    SpO2      Last Pain:  Vitals:   06/05/20 2323  TempSrc:   PainSc: 0-No pain         Complications: No complications documented.

## 2020-06-06 NOTE — ED Notes (Signed)
Pt transported to MRI 

## 2020-06-06 NOTE — Anesthesia Preprocedure Evaluation (Addendum)
Anesthesia Evaluation  Patient identified by MRN, date of birth, ID band Patient awake    Reviewed: Allergy & Precautions, NPO status , Patient's Chart, lab work & pertinent test results  Airway Mallampati: III  TM Distance: >3 FB Neck ROM: Full    Dental no notable dental hx. (+) Teeth Intact, Dental Advisory Given   Pulmonary neg pulmonary ROS,    Pulmonary exam normal breath sounds clear to auscultation       Cardiovascular hypertension, Pt. on medications Normal cardiovascular exam Rhythm:Regular Rate:Normal  Took BP meds this AM, but 158/69 in preop   Neuro/Psych PSYCHIATRIC DISORDERS Anxiety negative neurological ROS     GI/Hepatic negative GI ROS, Neg liver ROS,   Endo/Other  Morbid obesity  Renal/GU negative Renal ROS  negative genitourinary   Musculoskeletal lumbar stenosis    Abdominal   Peds  Hematology negative hematology ROS (+)   Anesthesia Other Findings initially suffered a fall about 1.5yrs ago with chronic gait instability and muscular "tightness" involving primarily RLE. Also noted feeling of right leg giving out while walking. Suffered another fall 3 days ago and has been unable to walk due to R>L leg weakness. No sensory changes, no changes in bladder function  Reproductive/Obstetrics negative OB ROS                          Anesthesia Physical Anesthesia Plan  ASA: III  Anesthesia Plan: General   Post-op Pain Management:    Induction: Intravenous  PONV Risk Score and Plan: 2 and Ondansetron, Dexamethasone, Midazolam and Treatment may vary due to age or medical condition  Airway Management Planned: Oral ETT and Video Laryngoscope Planned  Additional Equipment: None  Intra-op Plan:   Post-operative Plan: Extubation in OR  Informed Consent: I have reviewed the patients History and Physical, chart, labs and discussed the procedure including the risks, benefits and  alternatives for the proposed anesthesia with the patient or authorized representative who has indicated his/her understanding and acceptance.     Dental advisory given  Plan Discussed with: CRNA  Anesthesia Plan Comments: (Will need preop labs Significant cervical stenosis found incidentally on scans, will use in-line stabilization and glidescope/fiberoptic to avoid excess extension of neck during intubation )     Anesthesia Quick Evaluation

## 2020-06-06 NOTE — Anesthesia Postprocedure Evaluation (Signed)
Anesthesia Post Note  Patient: Randy Stewart  Procedure(s) Performed: LUMBAR LAMINECTOMY THORACIC ELEVEN-TWELVE (N/A Back)     Patient location during evaluation: PACU Anesthesia Type: General Level of consciousness: awake and alert, oriented and patient cooperative Pain management: pain level controlled Vital Signs Assessment: post-procedure vital signs reviewed and stable Respiratory status: spontaneous breathing, nonlabored ventilation and respiratory function stable Cardiovascular status: blood pressure returned to baseline and stable Postop Assessment: no apparent nausea or vomiting Anesthetic complications: no   No complications documented.  Last Vitals:  Vitals:   06/06/20 1630 06/06/20 1645  BP: (!) 128/113 135/88  Pulse: 86 79  Resp: 13 13  Temp:    SpO2: 94% 94%    Last Pain:  Vitals:   06/06/20 1615  TempSrc:   PainSc: 8                  Lannie Fields

## 2020-06-06 NOTE — Anesthesia Procedure Notes (Signed)
Procedure Name: Intubation Date/Time: 06/06/2020 2:36 PM Performed by: Modena Morrow, CRNA Pre-anesthesia Checklist: Patient identified, Emergency Drugs available, Suction available and Patient being monitored Patient Re-evaluated:Patient Re-evaluated prior to induction Oxygen Delivery Method: Circle system utilized Preoxygenation: Pre-oxygenation with 100% oxygen Induction Type: IV induction Ventilation: Mask ventilation without difficulty Laryngoscope Size: Glidescope and 4 Grade View: Grade I Tube type: Oral Tube size: 7.5 mm Number of attempts: 1 Airway Equipment and Method: Stylet and Oral airway Placement Confirmation: ETT inserted through vocal cords under direct vision,  positive ETCO2 and breath sounds checked- equal and bilateral Secured at: 23 cm Tube secured with: Tape Dental Injury: Teeth and Oropharynx as per pre-operative assessment

## 2020-06-07 ENCOUNTER — Encounter (HOSPITAL_COMMUNITY): Payer: Self-pay | Admitting: Neurosurgery

## 2020-06-07 LAB — CBC
HCT: 45.1 % (ref 39.0–52.0)
Hemoglobin: 14.3 g/dL (ref 13.0–17.0)
MCH: 25.7 pg — ABNORMAL LOW (ref 26.0–34.0)
MCHC: 31.7 g/dL (ref 30.0–36.0)
MCV: 81.1 fL (ref 80.0–100.0)
Platelets: 228 10*3/uL (ref 150–400)
RBC: 5.56 MIL/uL (ref 4.22–5.81)
RDW: 14.2 % (ref 11.5–15.5)
WBC: 16.3 10*3/uL — ABNORMAL HIGH (ref 4.0–10.5)
nRBC: 0 % (ref 0.0–0.2)

## 2020-06-07 LAB — BASIC METABOLIC PANEL
Anion gap: 11 (ref 5–15)
BUN: 17 mg/dL (ref 6–20)
CO2: 25 mmol/L (ref 22–32)
Calcium: 9.2 mg/dL (ref 8.9–10.3)
Chloride: 102 mmol/L (ref 98–111)
Creatinine, Ser: 0.94 mg/dL (ref 0.61–1.24)
GFR, Estimated: >60 mL/min (ref >60)
Glucose, Bld: 143 mg/dL — ABNORMAL HIGH (ref 70–99)
Potassium: 4.1 mmol/L (ref 3.5–5.1)
Sodium: 138 mmol/L (ref 135–145)

## 2020-06-07 LAB — PROTIME-INR
INR: 1.1 (ref 0.8–1.2)
Prothrombin Time: 13.4 seconds (ref 11.4–15.2)

## 2020-06-07 LAB — APTT: aPTT: 29 seconds (ref 24–36)

## 2020-06-07 MED ORDER — DEXTROSE 5 % IV SOLN
3.0000 g | INTRAVENOUS | Status: DC
  Filled 2020-06-07: qty 3000

## 2020-06-07 MED ORDER — CHLORHEXIDINE GLUCONATE CLOTH 2 % EX PADS: 6.0000 | MEDICATED_PAD | Freq: Once | CUTANEOUS | Status: DC

## 2020-06-07 MED ORDER — CHLORHEXIDINE GLUCONATE CLOTH 2 % EX PADS
6.0000 | MEDICATED_PAD | Freq: Once | CUTANEOUS | Status: AC
  Administered 2020-06-07: 6 via TOPICAL

## 2020-06-07 NOTE — Evaluation (Signed)
Occupational Therapy Evaluation Patient Details Name: KADEEN SROKA MRN: [id number] DOB: 25-May-1971 Today's Date: 06/07/2020    History of Present Illness 49 y.o. male with history of HTN who presented to the ED with several day history of bilateral proximal LE weakness. H/o fall at work in 2019 with left ankle fracture requiring operative repair. Difficulty ambulating since 2/2 RLE weakness. Brought to ED after fall while at football game. lumbar MRI and ultimately thoracic MRI which revealed moderate spinal stenosis at T11-12 with associated T2 signal changes. 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 Also with cervical stenosis, plan for ACDF on 11/5.   Clinical Impression   Pt admitted with the above diagnoses and presents with below problem list. Pt will benefit from continued acute OT to address the below listed deficits and maximize independence with basic ADLs prior to d/c home. At baseline, pt is independent with ADLs though he endorses, over the past several months, extra time and effort with mobility and LB ADLs. Pt currently min guard with functional (toilet, shower) transfers and household mobility, min A with LB ADLs, mod A with bed mobility. Currently needs rw for mobility, discussed AE for LB ADLs and pericare. Spouse present. Noted pt is for ACDF during this admission will assess for any changes in functional status post op but optimistic pt will progress well with OT goals.     Follow Up Recommendations  Outpatient OT;Home health OT;Supervision - Intermittent (OOB/mobility)    Equipment Recommendations  None recommended by OT (discussed reacher for LB dressing and tongs for pericare)    Recommendations for Other Services       Precautions / Restrictions Precautions Precautions: Back;Fall Precaution Booklet Issued: Yes (comment) Required Braces or Orthoses: Spinal Brace Spinal Brace: Lumbar corset Restrictions Weight Bearing Restrictions: No Other  Position/Activity Restrictions: per [employer] via messaging in EPIC pt ok for full RLE weightbearing       Mobility Bed Mobility Overal bed mobility: Needs Assistance Bed Mobility: Sit to Sidelying;Rolling Rolling: Min guard       Sit to sidelying: Mod assist General bed mobility comments: assist to powerup BLE onto bed. Spouse states she can assist with this at home    Transfers Overall transfer level: Needs assistance Equipment used: Rolling walker (2 wheeled) Transfers: Sit to/from Stand Sit to Stand: Min guard         General transfer comment: min guard for safety and cues for technique with rw. to/from recliner and elevated EOB    Balance Overall balance assessment: Needs assistance Sitting-balance support: Feet supported;No upper extremity supported;Single extremity supported Sitting balance-Leahy Scale: Fair     Standing balance support: No upper extremity supported;Bilateral upper extremity supported Standing balance-Leahy Scale: Poor Standing balance comment: fair static standing, poor dynamic.                           ADL either performed or assessed with clinical judgement   ADL Overall ADL's : Needs assistance/impaired Eating/Feeding: Set up;Sitting   Grooming: Set up;Min guard;Sitting;Standing   Upper Body Bathing: Set up;Sitting   Lower Body Bathing: Minimal assistance;Sit to/from stand   Upper Body Dressing : Set up;Sitting   Lower Body Dressing: Minimal assistance;Sit to/from stand   Toilet Transfer: Min guard;Ambulation;RW;Comfort height toilet;Grab bars   Toileting- Clothing Manipulation and Hygiene: Minimal assistance;Sit to/from stand   Tub/ Shower Transfer: Walk-in shower;Min guard;Ambulation;Rolling walker;3 in 1   Functional mobility during ADLs: Min guard;Rolling walker General ADL  Comments: Pt completed household distance functional mobility at min guard level. Began education on strategies and AE for LB  dressing and pericare.      Vision         Perception     Praxis      Pertinent Vitals/Pain Pain Assessment: 0-10 Pain Score: 2  Pain Location: RLE Pain Descriptors / Indicators: Sore;Tightness Pain Intervention(s): Monitored during session     Hand Dominance     Extremity/Trunk Assessment Upper Extremity Assessment Upper Extremity Assessment: Overall WFL for tasks assessed   Lower Extremity Assessment Lower Extremity Assessment: Defer to PT evaluation       Communication Communication Communication: No difficulties   Cognition Arousal/Alertness: Awake/alert Behavior During Therapy: WFL for tasks assessed/performed Overall Cognitive Status: Within Functional Limits for tasks assessed                                     General Comments       Exercises     Shoulder Instructions      Home Living Family/patient expects to be discharged to:: Private residence Living Arrangements: Spouse/significant other;Children Available Help at Discharge: Family;Available 24 hours/day Type of Home: House             Bathroom Shower/Tub: Walk-in shower         Home Equipment: Bedside commode;Grab bars - tub/shower          Prior Functioning/Environment Level of Independence: Independent        Comments: Independent at baseline        OT Problem List: Decreased strength;Decreased activity tolerance;Impaired balance (sitting and/or standing);Decreased knowledge of use of DME or AE;Decreased knowledge of precautions;Pain      OT Treatment/Interventions: Self-care/ADL training;Therapeutic exercise;DME and/or AE instruction;Therapeutic activities;Patient/family education;Balance training    OT Goals(Current goals can be found in the care plan section) Acute Rehab OT Goals Patient Stated Goal: walk better OT Goal Formulation: With patient/family Time For Goal Achievement: 06/21/20 Potential to Achieve Goals: Good ADL Goals Pt Will Perform  Grooming: with modified independence;standing;sitting Pt Will Perform Lower Body Bathing: with modified independence;sit to/from stand;with adaptive equipment Pt Will Perform Lower Body Dressing: with modified independence;sit to/from stand;with adaptive equipment Pt Will Transfer to Toilet: with modified independence;ambulating Pt Will Perform Toileting - Clothing Manipulation and hygiene: with modified independence;sit to/from stand;with adaptive equipment Pt Will Perform Tub/Shower Transfer: Shower transfer;with supervision;ambulating;3 in 1;rolling walker Additional ADL Goal #1: Pt will complete bed mobility at min guard level to prepare for EOB/OOB ADLs.  OT Frequency: Min 2X/week   Barriers to D/C:            Co-evaluation PT/OT/SLP Co-Evaluation/Treatment: Yes Reason for Co-Treatment: For patient/therapist safety;To address functional/ADL transfers   OT goals addressed during session: ADL's and self-care;Proper use of Adaptive equipment and DME      AM-PAC OT "6 Clicks" Daily Activity     Outcome Measure Help from another person eating meals?: None Help from another person taking care of personal grooming?: None Help from another person toileting, which includes using toliet, bedpan, or urinal?: A Little Help from another person bathing (including washing, rinsing, drying)?: A Little Help from another person to put on and taking off regular upper body clothing?: None Help from another person to put on and taking off regular lower body clothing?: A Little 6 Click Score: 21   End of Session Equipment Utilized During Treatment: Back  brace;Rolling walker  Activity Tolerance: Patient tolerated treatment well Patient left: in bed;with call bell/phone within reach;with family/visitor present  OT Visit Diagnosis: Unsteadiness on feet (R26.81);Muscle weakness (generalized) (M62.81);History of falling (Z91.81);Pain Pain - Right/Left: Right Pain - part of body: Leg;Ankle and joints  of foot                Time: 1137-1220 OT Time Calculation (min): 43 min Charges:  OT General Charges $OT Visit: 1 Visit OT Evaluation $OT Eval Low Complexity: 1 Low OT Treatments $Self Care/Home Management : 8-22 mins  Raynald Kemp, OT Acute Rehabilitation Services Pager: [phone] Office: [phone]   Pilar Grammes 06/07/2020, 1:47 PM

## 2020-06-07 NOTE — Progress Notes (Signed)
Orthopedic Tech Progress Note Patient Details:  Randy Stewart 07-03-1971 [id number] [place]en Lumbar Brace has been ordered  Patient ID: [name], male   DOB: [birth year], 49 y.o.   MRN: [id number]   [name] 06/07/2020, 2:21 AM

## 2020-06-07 NOTE — Evaluation (Signed)
Physical Therapy Evaluation Patient Details Name: Randy Stewart MRN: [id number] DOB: Feb 24, 1971 Today's Date: 06/07/2020   History of Present Illness  49 y.o. male with history of HTN who presented to the ED with several day history of bilateral proximal LE weakness. H/o fall at work in 2019 with left ankle fracture requiring operative repair. Difficulty ambulating since 2/2 RLE weakness. Brought to ED after fall while at football game. lumbar MRI and ultimately thoracic MRI which revealed moderate spinal stenosis at T11-12 with associated T2 signal changes. 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 Also with cervical stenosis, plan for ACDF on 11/5.  Clinical Impression  Pt OOB bed in recliner upon arrival of PT, agreeable to evaluation at this time. Prior to admission the pt was independent with mobility without use of AD, he reports he has noticed slight compensations in his gait since his original injury and ankle fx, but had returned to full independence with mobility. The pt now presents with limitations in functional mobility, strength, power, and muscular endurance due to above dx, and will continue to benefit from skilled PT to address these deficits. The pt was able to demo good initial transfers with VC for hand placement, but no physical assist to power up. He was also able to ambulate in the hall, but presents with poor functional activation of R hip flexors and minimal heel-toe gait pattern bilaterally. The pt currently uses R hip circumduction to advance his RLE, but is able to demo slight R hip activity with cues. The pt will continue to benefit from skilled PT acutely, and in OP setting once medically ready for d/c. Will continue to follow and evaluate needs for services and DME following ACDF planned for Friday 11/5.      Follow Up Recommendations Outpatient PT;Supervision for mobility/OOB    Equipment Recommendations  Rolling walker with 5" wheels    Recommendations  for Other Services       Precautions / Restrictions Precautions Precautions: Back;Fall Precaution Booklet Issued: Yes (comment) Precaution Comments: pt able to verbalize 3/3 Required Braces or Orthoses: Spinal Brace Spinal Brace: Lumbar corset;Applied in sitting position Restrictions Weight Bearing Restrictions: No Other Position/Activity Restrictions: per [employer] via messaging in EPIC pt ok for full RLE weightbearing       Mobility  Bed Mobility Overal bed mobility: Needs Assistance Bed Mobility: Sit to Sidelying;Rolling Rolling: Min guard       Sit to sidelying: Mod assist General bed mobility comments: assist to powerup BLE onto bed. Spouse states she can assist with this at home    Transfers Overall transfer level: Needs assistance Equipment used: Rolling walker (2 wheeled) Transfers: Sit to/from Stand Sit to Stand: Min guard         General transfer comment: min guard for safety and cues for technique with rw. to/from recliner and elevated EOB  Ambulation/Gait Ambulation/Gait assistance: Min guard Gait Distance (Feet): 150 Feet Assistive device: Rolling walker (2 wheeled) Gait Pattern/deviations: Step-to pattern;Decreased dorsiflexion - left;Decreased weight shift to left;Decreased stride length (circumduct with RLE) Gait velocity: 0.08 m/s Gait velocity interpretation: <1.31 ft/sec, indicative of household ambulator General Gait Details: Poor R hip flexion, pt able to increase with cues, but benefits from continued cues and fatigues rapidly. slow but no LOB     Balance Overall balance assessment: Needs assistance Sitting-balance support: Feet supported;No upper extremity supported;Single extremity supported Sitting balance-Leahy Scale: Fair Sitting balance - Comments: able to sit without UE support   Standing balance support: Bilateral upper  extremity supported;During functional activity Standing balance-Leahy Scale: Poor Standing balance  comment: fair static standing, poor dynamic.                             Pertinent Vitals/Pain Pain Assessment: 0-10 Pain Score: 1  Pain Location: back Pain Descriptors / Indicators: Sore;Tightness Pain Intervention(s): Monitored during session    Home Living Family/patient expects to be discharged to:: Private residence Living Arrangements: Spouse/significant other;Children Available Help at Discharge: Family;Available 24 hours/day Type of Home: House Home Access: Level entry     Home Layout: Able to live on main level with bedroom/bathroom Home Equipment: Bedside commode;Grab bars - tub/shower      Prior Function Level of Independence: Independent         Comments: Independent at baseline     Hand Dominance        Extremity/Trunk Assessment   Upper Extremity Assessment Upper Extremity Assessment: Overall WFL for tasks assessed    Lower Extremity Assessment Lower Extremity Assessment: Overall WFL for tasks assessed;RLE deficits/detail;LLE deficits/detail RLE Deficits / Details: pt reports generalized numbness and tingling in bilateral thighs, no percievable difference in strength. Splint on R ankle LLE Deficits / Details: pt reports generalized numbness and tingling in bilateral thighs, no percievable difference in strength.    Cervical / Trunk Assessment Cervical / Trunk Assessment: Normal  Communication   Communication: No difficulties  Cognition Arousal/Alertness: Awake/alert Behavior During Therapy: WFL for tasks assessed/performed Overall Cognitive Status: Within Functional Limits for tasks assessed                                        General Comments General comments (skin integrity, edema, etc.): VSS on RA    Exercises     Assessment/Plan    PT Assessment Patient needs continued PT services  PT Problem List Decreased strength;Decreased activity tolerance;Decreased balance;Decreased mobility;Pain       PT  Treatment Interventions DME instruction;Gait training;Functional mobility training;Therapeutic exercise;Therapeutic activities;Balance training;Patient/family education    PT Goals (Current goals can be found in the Care Plan section)  Acute Rehab PT Goals Patient Stated Goal: walk better PT Goal Formulation: With patient/family Time For Goal Achievement: 06/21/20 Potential to Achieve Goals: Good    Frequency Min 5X/week   Barriers to discharge        Co-evaluation PT/OT/SLP Co-Evaluation/Treatment: Yes Reason for Co-Treatment: For patient/therapist safety;To address functional/ADL transfers PT goals addressed during session: Mobility/safety with mobility;Balance;Proper use of DME;Strengthening/ROM OT goals addressed during session: ADL's and self-care;Proper use of Adaptive equipment and DME       AM-PAC PT "6 Clicks" Mobility  Outcome Measure Help needed turning from your back to your side while in a flat bed without using bedrails?: A Little Help needed moving from lying on your back to sitting on the side of a flat bed without using bedrails?: A Little Help needed moving to and from a bed to a chair (including a wheelchair)?: A Little Help needed standing up from a chair using your arms (e.g., wheelchair or bedside chair)?: A Little Help needed to walk in hospital room?: A Little Help needed climbing 3-5 steps with a railing? : A Lot 6 Click Score: 17    End of Session Equipment Utilized During Treatment: Gait belt;Back brace Activity Tolerance: Patient tolerated treatment well;Patient limited by fatigue Patient left: with call bell/phone within  reach;in bed;with family/visitor present Nurse Communication: Mobility status PT Visit Diagnosis: Other abnormalities of gait and mobility (R26.89);Pain Pain - Right/Left: Right Pain - part of body: Leg    Time: 1137-1212 PT Time Calculation (min) (ACUTE ONLY): 35 min   Charges:   PT Evaluation $PT Eval Moderate Complexity:  1 Mod          Rolm Baptise, PT, DPT   Acute Rehabilitation Department Pager #: [phone]  Gaetana Michaelis 06/07/2020, 2:13 PM

## 2020-06-07 NOTE — Progress Notes (Addendum)
  NEUROSURGERY PROGRESS NOTE   No issues overnight.  Minimal back pain this am Already noted improvement in BLE weakness   EXAM:  BP (!) 181/87 (BP Location: Right Wrist)   Pulse (!) 58   Temp 97.7 F (36.5 C) (Oral)   Resp 17   SpO2 96%   Awake, alert, oriented  Speech fluent, appropriate  CN grossly intact  5/5 BUE 4-/5 proximal RLE, 4/5 proximal LLE Incision: c/d/i   IMPRESSION/PLAN 49 y.o. male POD1 T11-12 laminectomy for thoracic myelopathy. Also with cervical stenosis. Doing well. Plan for ACDF tomorrow.  - NPO at midnight.    I have seen and examined Randy Stewart and agree with the exam, impression, and plan as documented in the note by Cindra Presume, PA-C.  POD#1 s/p T11-12 laminectomy with symptomatic improvement. Has concurrent cervical stenosis. Will plan on proceeding with ACDF tomorrow.  I reviewed the details of ACDF, associated risks, benefits, and alteratives with the patient and his wife today. All their questions were answered and they provided verbal consent to proceed.  Lisbeth Renshaw, MD [facility]

## 2020-06-08 ENCOUNTER — Encounter (HOSPITAL_COMMUNITY)
Admission: EM | Disposition: A | Discharge status: Home / Self Care | Payer: Self-pay | Source: Home / Self Care | Attending: Neurosurgery

## 2020-06-08 ENCOUNTER — Inpatient Hospital Stay (HOSPITAL_COMMUNITY): Payer: PRIVATE HEALTH INSURANCE | Admitting: Anesthesiology

## 2020-06-08 ENCOUNTER — Encounter (HOSPITAL_COMMUNITY): Payer: Self-pay | Admitting: Neurosurgery

## 2020-06-08 ENCOUNTER — Inpatient Hospital Stay (HOSPITAL_COMMUNITY): Payer: PRIVATE HEALTH INSURANCE

## 2020-06-08 HISTORY — PX: ANTERIOR CERVICAL DECOMP/DISCECTOMY FUSION: SHX1161

## 2020-06-08 LAB — TYPE AND SCREEN
ABO/RH(D): A NEG
Antibody Screen: NEGATIVE

## 2020-06-08 LAB — ABO/RH: ABO/RH(D): A NEG

## 2020-06-08 SURGERY — ANTERIOR CERVICAL DECOMPRESSION/DISCECTOMY FUSION 1 LEVEL
Anesthesia: General

## 2020-06-08 MED ORDER — HYDROMORPHONE HCL 1 MG/ML IJ SOLN
INTRAMUSCULAR | Status: AC
  Filled 2020-06-08: qty 1

## 2020-06-08 MED ORDER — 0.9 % SODIUM CHLORIDE (POUR BTL) OPTIME
TOPICAL | Status: DC | PRN
  Administered 2020-06-08: 1000 mL

## 2020-06-08 MED ORDER — ROCURONIUM BROMIDE 10 MG/ML (PF) SYRINGE
PREFILLED_SYRINGE | INTRAVENOUS | Status: DC | PRN
  Administered 2020-06-08: 30 mg via INTRAVENOUS
  Administered 2020-06-08 (×2): 50 mg via INTRAVENOUS
  Administered 2020-06-08: 20 mg via INTRAVENOUS

## 2020-06-08 MED ORDER — ORAL CARE MOUTH RINSE: 15.0000 mL | Freq: Once | OROMUCOSAL | Status: AC

## 2020-06-08 MED ORDER — LACTATED RINGERS IV SOLN: INTRAVENOUS | Status: DC

## 2020-06-08 MED ORDER — HYDRALAZINE HCL 20 MG/ML IJ SOLN
INTRAMUSCULAR | Status: AC
  Filled 2020-06-08: qty 1

## 2020-06-08 MED ORDER — ONDANSETRON HCL 4 MG/2ML IJ SOLN
INTRAMUSCULAR | Status: AC
  Filled 2020-06-08: qty 2

## 2020-06-08 MED ORDER — SUCCINYLCHOLINE CHLORIDE 200 MG/10ML IV SOSY
PREFILLED_SYRINGE | INTRAVENOUS | Status: DC | PRN
  Administered 2020-06-08: 120 mg via INTRAVENOUS

## 2020-06-08 MED ORDER — MIDAZOLAM HCL 2 MG/2ML IJ SOLN
INTRAMUSCULAR | Status: DC | PRN
  Administered 2020-06-08: 2 mg via INTRAVENOUS

## 2020-06-08 MED ORDER — ROCURONIUM BROMIDE 10 MG/ML (PF) SYRINGE
PREFILLED_SYRINGE | INTRAVENOUS | Status: AC
  Filled 2020-06-08: qty 10

## 2020-06-08 MED ORDER — FENTANYL CITRATE (PF) 250 MCG/5ML IJ SOLN
INTRAMUSCULAR | Status: AC
  Filled 2020-06-08: qty 5

## 2020-06-08 MED ORDER — THROMBIN 5000 UNITS EX SOLR
OROMUCOSAL | Status: DC | PRN
  Administered 2020-06-08: 5 mL via TOPICAL

## 2020-06-08 MED ORDER — HYDROMORPHONE HCL 1 MG/ML IJ SOLN
0.2500 mg | INTRAMUSCULAR | Status: DC | PRN
  Administered 2020-06-08 (×2): 0.5 mg via INTRAVENOUS

## 2020-06-08 MED ORDER — DEXAMETHASONE SODIUM PHOSPHATE 10 MG/ML IJ SOLN
INTRAMUSCULAR | Status: AC
  Filled 2020-06-08: qty 1

## 2020-06-08 MED ORDER — ACETAMINOPHEN 160 MG/5ML PO SOLN: 325.0000 mg | Freq: Once | ORAL | Status: DC | PRN

## 2020-06-08 MED ORDER — ONDANSETRON HCL 4 MG/2ML IJ SOLN
INTRAMUSCULAR | Status: DC | PRN
  Administered 2020-06-08: 4 mg via INTRAVENOUS

## 2020-06-08 MED ORDER — BUPIVACAINE HCL (PF) 0.5 % IJ SOLN
INTRAMUSCULAR | Status: AC
  Filled 2020-06-08: qty 30

## 2020-06-08 MED ORDER — FENTANYL CITRATE (PF) 250 MCG/5ML IJ SOLN
INTRAMUSCULAR | Status: DC | PRN
  Administered 2020-06-08 (×2): 125 ug via INTRAVENOUS
  Administered 2020-06-08 (×5): 50 ug via INTRAVENOUS

## 2020-06-08 MED ORDER — PHENYLEPHRINE HCL-NACL 10-0.9 MG/250ML-% IV SOLN
INTRAVENOUS | Status: DC | PRN
  Administered 2020-06-08: 25 ug/min via INTRAVENOUS

## 2020-06-08 MED ORDER — LABETALOL HCL 5 MG/ML IV SOLN
INTRAVENOUS | Status: DC | PRN
  Administered 2020-06-08: 10 mg via INTRAVENOUS
  Administered 2020-06-08 (×2): 5 mg via INTRAVENOUS

## 2020-06-08 MED ORDER — GLYCOPYRROLATE PF 0.2 MG/ML IJ SOSY
PREFILLED_SYRINGE | INTRAMUSCULAR | Status: AC
  Filled 2020-06-08: qty 1

## 2020-06-08 MED ORDER — AMISULPRIDE (ANTIEMETIC) 5 MG/2ML IV SOLN: 10.0000 mg | Freq: Once | INTRAVENOUS | Status: DC | PRN

## 2020-06-08 MED ORDER — DEXMEDETOMIDINE (PRECEDEX) IN NS 20 MCG/5ML (4 MCG/ML) IV SYRINGE
PREFILLED_SYRINGE | INTRAVENOUS | Status: AC
  Filled 2020-06-08: qty 5

## 2020-06-08 MED ORDER — ACETAMINOPHEN 10 MG/ML IV SOLN
1000.0000 mg | Freq: Once | INTRAVENOUS | Status: DC | PRN
  Administered 2020-06-08: 1000 mg via INTRAVENOUS

## 2020-06-08 MED ORDER — MEPERIDINE HCL 25 MG/ML IJ SOLN: 6.2500 mg | INTRAMUSCULAR | Status: DC | PRN

## 2020-06-08 MED ORDER — CHLORHEXIDINE GLUCONATE 0.12 % MT SOLN: 15.0000 mL | Freq: Once | OROMUCOSAL | Status: AC

## 2020-06-08 MED ORDER — THROMBIN 5000 UNITS EX SOLR
CUTANEOUS | Status: DC | PRN
  Administered 2020-06-08: 10000 [IU] via TOPICAL

## 2020-06-08 MED ORDER — ACETAMINOPHEN 325 MG PO TABS: 325.0000 mg | ORAL_TABLET | Freq: Once | ORAL | Status: DC | PRN

## 2020-06-08 MED ORDER — LIDOCAINE 2% (20 MG/ML) 5 ML SYRINGE
INTRAMUSCULAR | Status: AC
  Filled 2020-06-08: qty 5

## 2020-06-08 MED ORDER — HEMOSTATIC AGENTS (NO CHARGE) OPTIME
TOPICAL | Status: DC | PRN
  Administered 2020-06-08: 1 via TOPICAL

## 2020-06-08 MED ORDER — LIDOCAINE-EPINEPHRINE 1 %-1:100000 IJ SOLN
INTRAMUSCULAR | Status: DC | PRN
  Administered 2020-06-08: 3.5 mL

## 2020-06-08 MED ORDER — BUPIVACAINE HCL 0.5 % IJ SOLN
INTRAMUSCULAR | Status: DC | PRN
  Administered 2020-06-08: 3.5 mL

## 2020-06-08 MED ORDER — CHLORHEXIDINE GLUCONATE 0.12 % MT SOLN
OROMUCOSAL | Status: AC
  Administered 2020-06-08: 15 mL via OROMUCOSAL
  Filled 2020-06-08: qty 15

## 2020-06-08 MED ORDER — PROPOFOL 10 MG/ML IV BOLUS
INTRAVENOUS | Status: DC | PRN
  Administered 2020-06-08: 200 mg via INTRAVENOUS

## 2020-06-08 MED ORDER — MUPIROCIN 2 % EX OINT
TOPICAL_OINTMENT | Freq: Two times a day (BID) | CUTANEOUS | Status: DC
  Administered 2020-06-09 – 2020-06-11 (×2): 1 via NASAL
  Filled 2020-06-08: qty 22

## 2020-06-08 MED ORDER — THROMBIN 5000 UNITS EX SOLR
CUTANEOUS | Status: AC
  Filled 2020-06-08: qty 15000

## 2020-06-08 MED ORDER — LIDOCAINE-EPINEPHRINE 1 %-1:100000 IJ SOLN
INTRAMUSCULAR | Status: AC
  Filled 2020-06-08: qty 1

## 2020-06-08 MED ORDER — SUGAMMADEX SODIUM 200 MG/2ML IV SOLN
INTRAVENOUS | Status: DC | PRN
  Administered 2020-06-08 (×3): 50 mg via INTRAVENOUS

## 2020-06-08 MED ORDER — ACETAMINOPHEN 10 MG/ML IV SOLN
INTRAVENOUS | Status: AC
  Filled 2020-06-08: qty 100

## 2020-06-08 MED ORDER — METHOCARBAMOL 500 MG PO TABS
ORAL_TABLET | ORAL | Status: AC
  Filled 2020-06-08: qty 1

## 2020-06-08 MED ORDER — DEXAMETHASONE SODIUM PHOSPHATE 10 MG/ML IJ SOLN
INTRAMUSCULAR | Status: DC | PRN
  Administered 2020-06-08: 10 mg via INTRAVENOUS

## 2020-06-08 MED ORDER — MIDAZOLAM HCL 2 MG/2ML IJ SOLN
INTRAMUSCULAR | Status: AC
  Filled 2020-06-08: qty 2

## 2020-06-08 MED ORDER — LIDOCAINE 2% (20 MG/ML) 5 ML SYRINGE
INTRAMUSCULAR | Status: DC | PRN
  Administered 2020-06-08: 25 mg via INTRAVENOUS
  Administered 2020-06-08: 40 mg via INTRAVENOUS

## 2020-06-08 MED ORDER — HYDRALAZINE HCL 20 MG/ML IJ SOLN
INTRAMUSCULAR | Status: DC | PRN
  Administered 2020-06-08: 5 mg via INTRAVENOUS
  Administered 2020-06-08: 10 mg via INTRAVENOUS

## 2020-06-08 MED ORDER — DEXMEDETOMIDINE HCL 200 MCG/2ML IV SOLN
INTRAVENOUS | Status: DC | PRN
  Administered 2020-06-08: 4 ug via INTRAVENOUS
  Administered 2020-06-08: 8 ug via INTRAVENOUS
  Administered 2020-06-08 (×2): 4 ug via INTRAVENOUS

## 2020-06-08 MED ORDER — DEXTROSE 5 % IV SOLN
3.0000 g | INTRAVENOUS | Status: AC
  Administered 2020-06-08: 3 g via INTRAVENOUS
  Filled 2020-06-08: qty 3000

## 2020-06-08 SURGICAL SUPPLY — 67 items
ADH SKN CLS APL DERMABOND .7 (GAUZE/BANDAGES/DRESSINGS) ×1
APL SKNCLS STERI-STRIP NONHPOA (GAUZE/BANDAGES/DRESSINGS)
BAND INSRT 18 STRL LF DISP RB (MISCELLANEOUS) ×2
BAND RUBBER #18 3X1/16 STRL (MISCELLANEOUS) ×6 IMPLANT
BENZOIN TINCTURE PRP APPL 2/3 (GAUZE/BANDAGES/DRESSINGS) IMPLANT
BLADE CLIPPER SURG (BLADE) ×2 IMPLANT
BLADE SURG 11 STRL SS (BLADE) ×3 IMPLANT
BLADE ULTRA TIP 2M (BLADE) ×2 IMPLANT
BUR MATCHSTICK NEURO 3.0 LAGG (BURR) ×3 IMPLANT
CANISTER SUCT 3000ML PPV (MISCELLANEOUS) ×3 IMPLANT
CARTRIDGE OIL MAESTRO DRILL (MISCELLANEOUS) ×1 IMPLANT
CLOSURE WOUND 1/2 X4 (GAUZE/BANDAGES/DRESSINGS)
COVER WAND RF STERILE (DRAPES) ×3 IMPLANT
DECANTER SPIKE VIAL GLASS SM (MISCELLANEOUS) ×3 IMPLANT
DERMABOND ADVANCED (GAUZE/BANDAGES/DRESSINGS) ×2
DERMABOND ADVANCED .7 DNX12 (GAUZE/BANDAGES/DRESSINGS) ×1 IMPLANT
DEVICE ENDSKLTN TC NANOLCK 6MM (Cage) IMPLANT
DIFFUSER DRILL AIR PNEUMATIC (MISCELLANEOUS) ×3 IMPLANT
DRAPE C-ARM 42X72 X-RAY (DRAPES) ×6 IMPLANT
DRAPE HALF SHEET 40X57 (DRAPES) IMPLANT
DRAPE LAPAROTOMY 100X72 PEDS (DRAPES) ×3 IMPLANT
DRAPE MICROSCOPE LEICA (MISCELLANEOUS) ×3 IMPLANT
DRSG OPSITE 4X5.5 SM (GAUZE/BANDAGES/DRESSINGS) ×6 IMPLANT
DRSG OPSITE POSTOP 4X10 (GAUZE/BANDAGES/DRESSINGS) ×2 IMPLANT
DURAPREP 6ML APPLICATOR 50/CS (WOUND CARE) ×3 IMPLANT
ELECT COATED BLADE 2.86 ST (ELECTRODE) ×3 IMPLANT
ELECT REM PT RETURN 9FT ADLT (ELECTROSURGICAL) ×3
ELECTRODE REM PT RTRN 9FT ADLT (ELECTROSURGICAL) ×1 IMPLANT
ENDOSKELETON TC NANOLOCK 6MM (Cage) ×6 IMPLANT
GAUZE 4X4 16PLY RFD (DISPOSABLE) IMPLANT
GLOVE BIO SURGEON STRL SZ7 (GLOVE) ×2 IMPLANT
GLOVE BIO SURGEON STRL SZ7.5 (GLOVE) ×2 IMPLANT
GLOVE BIOGEL PI IND STRL 7.5 (GLOVE) ×2 IMPLANT
GLOVE BIOGEL PI INDICATOR 7.5 (GLOVE) ×6
GLOVE ECLIPSE 7.0 STRL STRAW (GLOVE) ×5 IMPLANT
GLOVE EXAM NITRILE XL STR (GLOVE) IMPLANT
GOWN STRL REUS W/ TWL LRG LVL3 (GOWN DISPOSABLE) ×2 IMPLANT
GOWN STRL REUS W/ TWL XL LVL3 (GOWN DISPOSABLE) IMPLANT
GOWN STRL REUS W/TWL 2XL LVL3 (GOWN DISPOSABLE) IMPLANT
GOWN STRL REUS W/TWL LRG LVL3 (GOWN DISPOSABLE) ×6
GOWN STRL REUS W/TWL XL LVL3 (GOWN DISPOSABLE)
HEMOSTAT POWDER KIT SURGIFOAM (HEMOSTASIS) ×3 IMPLANT
KIT BASIN OR (CUSTOM PROCEDURE TRAY) ×3 IMPLANT
KIT TURNOVER KIT B (KITS) ×3 IMPLANT
NDL SPNL 22GX3.5 QUINCKE BK (NEEDLE) ×1 IMPLANT
NEEDLE HYPO 22GX1.5 SAFETY (NEEDLE) ×3 IMPLANT
NEEDLE SPNL 22GX3.5 QUINCKE BK (NEEDLE) ×3 IMPLANT
NS IRRIG 1000ML POUR BTL (IV SOLUTION) ×3 IMPLANT
OIL CARTRIDGE MAESTRO DRILL (MISCELLANEOUS) ×3
PACK LAMINECTOMY NEURO (CUSTOM PROCEDURE TRAY) ×3 IMPLANT
PAD ARMBOARD 7.5X6 YLW CONV (MISCELLANEOUS) ×9 IMPLANT
PLATE ZEVO 1LVL 17MM (Plate) ×2 IMPLANT
PLATE ZEVO 1LVL 19MM (Plate) ×3 IMPLANT
PUTTY DBF 3CC CORTICAL FIBERS (Putty) ×2 IMPLANT
SCREW 3.5 SELFDRILL 15MM VARI (Screw) ×14 IMPLANT
SCREW 4.0 SELFDRILL 15MM VARI (Screw) ×2 IMPLANT
SPONGE INTESTINAL PEANUT (DISPOSABLE) ×3 IMPLANT
SPONGE SURGIFOAM ABS GEL SZ50 (HEMOSTASIS) ×3 IMPLANT
STAPLER VISISTAT 35W (STAPLE) ×3 IMPLANT
STRIP CLOSURE SKIN 1/2X4 (GAUZE/BANDAGES/DRESSINGS) IMPLANT
SUT VIC AB 3-0 SH 8-18 (SUTURE) ×3 IMPLANT
SUT VICRYL 3-0 RB1 18 ABS (SUTURE) ×4 IMPLANT
TAPE CLOTH 3X10 TAN LF (GAUZE/BANDAGES/DRESSINGS) ×3 IMPLANT
TIP KERRISON THIN FOOTPLATE 2M (MISCELLANEOUS) ×6 IMPLANT
TOWEL GREEN STERILE (TOWEL DISPOSABLE) ×3 IMPLANT
TOWEL GREEN STERILE FF (TOWEL DISPOSABLE) ×3 IMPLANT
WATER STERILE IRR 1000ML POUR (IV SOLUTION) ×3 IMPLANT

## 2020-06-08 NOTE — Anesthesia Postprocedure Evaluation (Signed)
Anesthesia Post Note  Patient: Randy Stewart  Procedure(s) Performed: Anterior Cervical Discectomy Fusion Cervical Four-Five, Cervical Five-Six (N/A )     Patient location during evaluation: PACU Anesthesia Type: General Level of consciousness: awake and alert, patient cooperative and oriented Pain management: pain level controlled Vital Signs Assessment: post-procedure vital signs reviewed and stable Respiratory status: spontaneous breathing, nonlabored ventilation and respiratory function stable Cardiovascular status: blood pressure returned to baseline and stable Postop Assessment: no apparent nausea or vomiting Anesthetic complications: no   No complications documented.  Last Vitals:  Vitals:   06/08/20 1925 06/08/20 1940  BP: (!) 161/74 (!) 160/70  Pulse: 92 90  Resp: 19 17  Temp:  36.8 C  SpO2: 92% 92%    Last Pain:  Vitals:   06/08/20 1940  TempSrc:   PainSc: Asleep                 Valon Glasscock,E. Sylvi Rybolt

## 2020-06-08 NOTE — Progress Notes (Signed)
  NEUROSURGERY PROGRESS NOTE   No issues overnight. Ready for ACDF today.  EXAM:  BP (!) 177/87 (BP Location: Left Arm)   Pulse (!) 53   Temp 97.8 F (36.6 C) (Oral)   Resp 17   SpO2 100%   Awake, alert, oriented  Speech fluent, appropriate  CN grossly intact  5/5 BUE Mild proximal BLE weakness R>L Wound c/d/i  IMPRESSION:  49 y.o. male POD#2 s/p T11-12 laminectomy, preop myelopathy symptoms improving. Has concurrent severe cervical stenosis with signal change at C4-5 and moderate stenosis at C5-6.  PLAN: - Will proceed with ACDF C4-5 and C5-6  Upon further review of the MRI, while the severe stenosis with signal change is at C4-5, there is also moderate central stenosis at C5-6. I think it makes sense to address both levels today. I reviewed this with the patient and his wife who agree. We will therefore plan on ACDF at both C4-5 and C5-6. We did again discuss risks of the surgery and expected postop course. All their questions today were answered.

## 2020-06-08 NOTE — Op Note (Signed)
NEUROSURGERY OPERATIVE NOTE   PREOP DIAGNOSIS: Cervical stenosis with myelopath, C4-5, C5-6  POSTOP DIAGNOSIS: Same  PROCEDURE: 1. Discectomy at C4-5, C5-6 for decompression of spinal cord and exiting nerve roots  2. Placement of intervertebral biomechanical device - Medtronic Titan 11mm medium x2 3. Placement of anterior instrumentation consisting of interbody plate and screws - Medtronic Zevo 70mm plate, 48GQ screws @ C4-5, 36mm plate, 91QX screws @ C5-6  4. Use of morselized bone allograft  5. Arthrodesis C4-5, C5-6, anterior interbody technique  6. Use of intraoperative microscope  SURGEON: Dr. Lisbeth Renshaw, MD  ASSISTANT: Cindra Presume, PA-C  ANESTHESIA: General Endotracheal  EBL: 25cc  SPECIMENS: None  DRAINS: None  COMPLICATIONS: None immediate  CONDITION: Hemodynamically stable to PACU  HISTORY: Randy Stewart is a 49 y.o. y.o. male who presented to the emergency department after suffering a fall with inability to walk and primarily proximal lower extremity weakness.  MRI of the thoracic spine demonstrated significant stenosis with spinal cord signal change at T11-12.  While he had minimal upper extremity symptoms, he did have some hyperreflexia.  MRI of the cervical spine revealed severe stenosis at C4-5 and moderate stenosis at C5-6 with associated spinal cord signal change at the 4 5 level.  After undergoing T11-12 decompressive laminectomy a few days ago he presents today for anterior cervical discectomy at C4-5 and C5-6.  Risks, benefits, and alternatives to surgery as well as the expected postoperative course and recovery were reviewed in detail with the patient and his wife.  After all questions were answered informed consent was obtained and witnessed.  PROCEDURE IN DETAIL: The patient was brought to the operating room and transferred to the operative table. After induction of general anesthesia, the patient was positioned on the operative table in the  supine position with all pressure points meticulously padded. The skin of the neck was then prepped and draped in the usual sterile fashion.  After timeout was conducted, the skin was infiltrated with local anesthetic.  Right sided transverse skin incision was then made sharply and Bovie electrocautery was used to dissect the subcutaneous tissue until the platysma was identified. The platysma was then divided and undermined. The sternocleidomastoid muscle was then identified and, utilizing natural fascial planes in the neck, the prevertebral fascia was identified and the carotid sheath was retracted laterally and the trachea and esophagus retracted medially.  Spinal needle was then introduced into the C4-5 disc space and location was confirmed with intraoperative lateral fluoroscopy.  Bovie electrocautery was used to dissect in the subperiosteal plane and elevate the longus coli muscles bilaterally.  Table mounted retractors were then placed.  At this point, the microscope was draped and brought into the field, and the remainder of the case was done under the microscope using microdissecting technique.  The C4-5 disc space was incised sharply and rongeurs were use to initially complete a discectomy. The high-speed drill was then used to complete discectomy until the posterior annulus was identified and removed and the posterior longitudinal ligament was identified. Using a nerve hook, the PLL was elevated, and Kerrison rongeurs were used to remove the posterior longitudinal ligament and the ventral thecal sac was identified. Using a combination of curettes and Kerrison punches, complete decompression of the thecal sac and exiting nerve roots at this level was completed.  I did note significant disc protrusion compressing the thecal sac centrally, as well as a relatively large posterior osteophyte emanating from the posterior aspect of the C4 vertebral body.  There  did also appear to be some superiorly migrated  disc material which was removed with microhooks.  Having completed our decompression at C4-5, attention was turned to placement of the intervertebral device. Trial spacers were used to select a medium with 6 mm lordotic graft.  The endplates were prepared with curettes to remove any overlying cartilage.  This graft was then filled with morcellized allograft, and inserted.  The above 19 mm plate was then placed across the interspace and secured with 15 mm screws in C4 and C5.    Attention was then turned to the C5-6 level.  In a similar fashion, superficial discectomy was completed after incision of the anterior annulus.  Osteophytes were removed with Kerrison punches.  The remainder of the discectomy was completed with a high-speed drill and the posterior longitudinal ligament was identified.  This was elevated with a nerve hook and removed piecemeal with Kerrison punches.  Thecal sac underneath was then identified.  Thecal sac appeared to be compressed primarily by a relatively large central osteophyte emanating from the C5 vertebral body.  Once the osteophyte and the posterior longitudinal ligament were removed, the thecal sac appeared to be well decompressed.  This was confirmed by easy passage of a small dissector within the ventral epidural space.  Hemostasis was then secured with morselized Gelfoam with thrombin.  Endplates were then prepared with curettes.  Another 6 mm medium with lordotic graft was selected with the trial.  The graft was packed with morselized bone allograft.  This was then tapped into place just deep to the anterior aspect of the vertebral body.  17 mm plate was then selected and placed across the interspace.  This was secured with 15 mm screws.  The muscle edges and bone were then covered with a layer of morselized Gelfoam with thrombin and a 4 x 4 sponge.  Lateral fluoroscopy was taken which confirmed good placement of the implanted hardware and good cervical alignment.  Sponge  was then removed and the wound was irrigated with normal saline irrigation.  No active bleeding was identified. The platysma muscle was then closed using interrupted 3-0 Vicryl sutures, and the skin was closed with an interrupted 3-0 Vicry subcuticular stitch. Dermabond and sterile dressings were then applied and the drapes removed.  The patient tolerated the procedure well and was extubated in the room and taken to the postanesthesia care unit in stable condition.  At the end of the case all sponge, needle, instrument, and cottonoid counts were correct.

## 2020-06-08 NOTE — Anesthesia Procedure Notes (Signed)
Procedure Name: Intubation Date/Time: 06/08/2020 3:47 PM Performed by: Modena Morrow, CRNA Pre-anesthesia Checklist: Patient identified, Emergency Drugs available, Suction available and Patient being monitored Patient Re-evaluated:Patient Re-evaluated prior to induction Oxygen Delivery Method: Circle system utilized Preoxygenation: Pre-oxygenation with 100% oxygen Induction Type: IV induction Ventilation: Mask ventilation without difficulty Laryngoscope Size: Glidescope and 4 Grade View: Grade I Tube type: Oral Tube size: 7.5 mm Number of attempts: 1 Airway Equipment and Method: Stylet and Oral airway Placement Confirmation: ETT inserted through vocal cords under direct vision,  positive ETCO2 and breath sounds checked- equal and bilateral Secured at: 24 cm Tube secured with: Tape Dental Injury: Teeth and Oropharynx as per pre-operative assessment

## 2020-06-08 NOTE — Anesthesia Preprocedure Evaluation (Addendum)
Anesthesia Evaluation  Patient identified by MRN, date of birth, ID band Patient awake    Reviewed: Allergy & Precautions, NPO status , Patient's Chart, lab work & pertinent test results  Airway Mallampati: II  TM Distance: >3 FB Neck ROM: Full    Dental  (+) Teeth Intact, Dental Advisory Given   Pulmonary neg pulmonary ROS,    breath sounds clear to auscultation       Cardiovascular hypertension, Pt. on medications  Rhythm:Regular Rate:Normal     Neuro/Psych Anxiety negative neurological ROS     GI/Hepatic negative GI ROS, Neg liver ROS,   Endo/Other  negative endocrine ROS  Renal/GU negative Renal ROS     Musculoskeletal negative musculoskeletal ROS (+)   Abdominal Normal abdominal exam  (+)   Peds  Hematology negative hematology ROS (+)   Anesthesia Other Findings   Reproductive/Obstetrics                            Anesthesia Physical Anesthesia Plan  ASA: II  Anesthesia Plan: General   Post-op Pain Management:    Induction: Intravenous  PONV Risk Score and Plan: 3 and Ondansetron, Dexamethasone and Midazolam  Airway Management Planned: Oral ETT and Video Laryngoscope Planned  Additional Equipment: None  Intra-op Plan:   Post-operative Plan: Extubation in OR  Informed Consent: I have reviewed the patients History and Physical, chart, labs and discussed the procedure including the risks, benefits and alternatives for the proposed anesthesia with the patient or authorized representative who has indicated his/her understanding and acceptance.       Plan Discussed with: CRNA  Anesthesia Plan Comments:        Anesthesia Quick Evaluation

## 2020-06-08 NOTE — Transfer of Care (Signed)
Immediate Anesthesia Transfer of Care Note  Patient: Randy Stewart  Procedure(s) Performed: Anterior Cervical Discectomy Fusion Cervical Four-Five, Cervical Five-Six (N/A )  Patient Location: PACU  Anesthesia Type:General  Level of Consciousness: drowsy and patient cooperative  Airway & Oxygen Therapy: Patient Spontanous Breathing and Patient connected to face mask oxygen  Post-op Assessment: Report given to RN, Post -op Vital signs reviewed and stable and Patient moving all extremities X 4  Post vital signs: Reviewed and stable  Last Vitals:  Vitals Value Taken Time  BP 191/88 06/08/20 1840  Temp    Pulse 96 06/08/20 1843  Resp 19 06/08/20 1843  SpO2 97 % 06/08/20 1843  Vitals shown include unvalidated device data.  Last Pain:  Vitals:   06/08/20 1110  TempSrc: Oral  PainSc:          Complications: No complications documented.

## 2020-06-09 NOTE — Progress Notes (Signed)
Orthopedic Tech Progress Note Patient Details:  JERRITT CARDOZA Nov 14, 1970 [id number] Spoke with RN who will check with provider to see if patient still needs brace. If so RN will call materials to get [facility] brace Patient ID: JOVONNI BORQUEZ, male   DOB: February 21, 1971, 49 y.o.   MRN: [id number]   Michelle Piper 06/09/2020, 1:48 PM

## 2020-06-09 NOTE — Progress Notes (Signed)
Neurosurgery Service Progress Note  Subjective: No acute events overnight, some odynophagia, no sig dysphagia, neck pain well controlled   Objective: Vitals:   06/09/20 0608 06/09/20 0654 06/09/20 0723 06/09/20 0900  BP: (!) 184/74 (!) 164/72 (!) 180/76 (!) 157/79  Pulse: (!) 59 (!) 50 61 (!) 54  Resp: (!) 26 16 15  (!) 26  Temp:   98.3 F (36.8 C)   TempSrc:   Oral   SpO2: 97% 98% 98% 96%    Physical Exam: Strength 5/5 in BUE and LLE, RLE with distal cast and 4/5 proximally, SILTx4, +b/l hoffman's, incisions c/d/i  Assessment & Plan: 49 y.o. man s/p thoracic lami and 2 level ACDF, recovering well.  -ADAT / activity as tolerated -can wear soft collar for comfort prn, will see if Nundkumar wants a hard collar on Monday, safe to ambulate without hard collar -SCDs/TEDs, SQH tomorrow -will f/u on therapy recs for discharge planning  Monday  06/09/20 11:50 AM

## 2020-06-09 NOTE — Progress Notes (Signed)
Physical Therapy Treatment Patient Details Name: Randy Stewart MRN: [id number] DOB: May 27, 1971 Today's Date: 06/09/2020    History of Present Illness 49 y.o. male with history of HTN who presented to the ED with several day history of bilateral proximal LE weakness. H/o fall at work in 2019 with left ankle fracture requiring operative repair. Difficulty ambulating since 2/2 RLE weakness. Brought to ED after fall while at football game. lumbar MRI and ultimately thoracic MRI which revealed moderate spinal stenosis at T11-12 with associated T2 signal changes. 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 Also with cervical stenosis, s/p ACDF on 11/5.    PT Comments    Initially pt was reluctant to move, but with encouragement thrived with mobility.  Emphasis on sit to stand technique, progressing ambulation with the RW and general education with pt/wife.    Follow Up Recommendations  Outpatient PT;Supervision for mobility/OOB     Equipment Recommendations  Rolling walker with 5" wheels (TBA)    Recommendations for Other Services       Precautions / Restrictions Precautions Precautions: Back;Fall Precaution Booklet Issued: Yes (comment) Required Braces or Orthoses: Spinal Brace Spinal Brace: Lumbar corset;Applied in sitting position    Mobility  Bed Mobility               General bed mobility comments: OOB on arrival  Transfers Overall transfer level: Needs assistance Equipment used: Rolling walker (2 wheeled) Transfers: Sit to/from Stand Sit to Stand: Max assist;Min assist (from low surface Max, much better with chair built up)         General transfer comment: assist for boost, much more easy with built up height  Ambulation/Gait Ambulation/Gait assistance: Min guard;Min assist Gait Distance (Feet): 90 Feet Assistive device: Rolling walker (2 wheeled) Gait Pattern/deviations: Step-through pattern   Gait velocity interpretation: <1.8 ft/sec, indicate of  risk for recurrent falls General Gait Details: mild instability, L LE> R LE.  R continues with mild circumduction, but worked on heel/toe pattern   Social research officer, government Rank[employer] (Stroke Patients Only)       Balance Overall balance assessment: Needs assistance Sitting-balance support: No upper extremity supported;Feet supported Sitting balance-Leahy Scale: Fair Sitting balance - Comments: able to sit without UE support     Standing balance-Leahy Scale: Poor Standing balance comment: fair static standing, poor dynamic.                            Cognition Arousal/Alertness: Awake/alert Behavior During Therapy: WFL for tasks assessed/performed Overall Cognitive Status: Within Functional Limits for tasks assessed                                        Exercises      General Comments General comments (skin integrity, edema, etc.): vss.   Reinforced back/cervical care and prec, log roll, bracing issues, lifting restrictions, and progression of activity.      Pertinent Vitals/Pain Pain Assessment: Faces Faces Pain Scale: Hurts a little bit Pain Location: back/neck Pain Descriptors / Indicators: Sore;Tightness Pain Intervention(s): Monitored during session    Home Living                      Prior Function  PT Goals (current goals can now be found in the care plan section) Acute Rehab PT Goals PT Goal Formulation: With patient/family Time For Goal Achievement: 06/21/20 Potential to Achieve Goals: Good Progress towards PT goals: Progressing toward goals    Frequency    Min 5X/week      PT Plan Current plan remains appropriate    Co-evaluation              AM-PAC PT "6 Clicks" Mobility   Outcome Measure  Help needed turning from your back to your side while in a flat bed without using bedrails?: A Little Help needed moving from lying on your back to sitting on the side  of a flat bed without using bedrails?: A Little Help needed moving to and from a bed to a chair (including a wheelchair)?: A Little Help needed standing up from a chair using your arms (e.g., wheelchair or bedside chair)?: A Lot Help needed to walk in hospital room?: A Little Help needed climbing 3-5 steps with a railing? : A Lot 6 Click Score: 16    End of Session Equipment Utilized During Treatment: Back brace;Cervical collar Activity Tolerance: Patient tolerated treatment well Patient left: in chair;with call bell/phone within reach;with family/visitor present Nurse Communication: Mobility status PT Visit Diagnosis: Other abnormalities of gait and mobility (R26.89);Pain Pain - Right/Left: Right Pain - part of body: Leg (incisional back)     Time: 7253-6644 PT Time Calculation (min) (ACUTE ONLY): 36 min  Charges:  $Gait Training: 8-22 mins $Therapeutic Activity: 8-22 mins                     06/09/2020  Jacinto Halim., PT Acute Rehabilitation Services [phone]  (pager) [phone]  (office)   Eliseo Gum Micha Erck 06/09/2020, 1:46 PM

## 2020-06-10 MED ORDER — HEPARIN SODIUM (PORCINE) 5000 UNIT/ML IJ SOLN
5000.0000 [IU] | Freq: Three times a day (TID) | INTRAMUSCULAR | Status: DC
  Administered 2020-06-10 – 2020-06-11 (×4): 5000 [IU] via SUBCUTANEOUS
  Filled 2020-06-10 (×3): qty 1

## 2020-06-10 NOTE — Plan of Care (Signed)

## 2020-06-10 NOTE — Progress Notes (Signed)
Neurosurgery Service Progress Note  Subjective: No acute events overnight, no significant dysphagia, some urinary overflow pattern incontinence in the morning but continent throughout the day  Objective: Vitals:   06/09/20 2347 06/10/20 0000 06/10/20 0339 06/10/20 0808  BP: (!) 181/74 (!) 178/80 (!) 178/84 (!) 178/81  Pulse: (!) 59 (!) 57 (!) 56 (!) 58  Resp: 16 16 18 20   Temp: 98.1 F (36.7 C)  98.4 F (36.9 C) 98.8 F (37.1 C)  TempSrc: Oral  Oral   SpO2: 97% 97% 98% 94%    Physical Exam: Strength 5/5 in BUE and LLE, RLE with distal cast and 4/5 proximally, SILTx4, +b/l hoffman's, incisions c/d/i  Assessment & Plan: 49 y.o. man s/p thoracic lami and 2 level ACDF, recovering well.  -ADAT / activity as tolerated -SCDs/TEDs, SQH -PT/OT rec outpt therapy, discharge home tomorrow  44  06/10/20 10:41 AM

## 2020-06-10 NOTE — Progress Notes (Signed)
Physical Therapy Treatment Patient Details Name: Randy Stewart MRN: [id number] DOB: Dec 01, 1970 Today's Date: 06/10/2020    History of Present Illness 49 y.o. male with history of HTN who presented to the ED with several day history of bilateral proximal LE weakness. H/o fall at work in 2019 with left ankle fracture requiring operative repair. Difficulty ambulating since 2/2 RLE weakness. Brought to ED after fall while at football game. lumbar MRI and ultimately thoracic MRI which revealed moderate spinal stenosis at T11-12 with associated T2 signal changes. 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 Also with cervical stenosis, s/p ACDF on 11/5.    PT Comments    Pt tolerates treatment well, ambulating for increased distances. Pt requires less physical assistance during transfers at this time and reports some improvement in confidence in gait and mobility quality. Pt does report some instances of incontinence in the morning, without urge to urinate until initiating mobility and then having difficulty holding his urine until in the bathroom or with urinal ready. Pt will benefit from continued aggressive mobilization and PT POC to improve activity tolerance and to initiate stair training. PT continues to recommend discharge home with outpatient PT and intermittent assistance from family.   Follow Up Recommendations  Outpatient PT;Supervision for mobility/OOB     Equipment Recommendations  Rolling walker with 5" wheels    Recommendations for Other Services       Precautions / Restrictions Precautions Precautions: Back;Fall Precaution Booklet Issued: No Precaution Comments: pt able to recall all back and neck precautions Required Braces or Orthoses: Spinal Brace Spinal Brace: Lumbar corset (on upon arrival) Restrictions Weight Bearing Restrictions: No    Mobility  Bed Mobility               General bed mobility comments: pt received and left in  recliner  Transfers Overall transfer level: Needs assistance Equipment used: Rolling walker (2 wheeled) Transfers: Sit to/from Stand Sit to Stand: Min guard            Ambulation/Gait Ambulation/Gait assistance: Min guard Gait Distance (Feet): 250 Feet Assistive device: Rolling walker (2 wheeled) Gait Pattern/deviations: Step-through pattern Gait velocity: reduced Gait velocity interpretation: <1.8 ft/sec, indicate of risk for recurrent falls General Gait Details: pt with steady step through gait, PT notes some quivering of R quad during stance phase with fatigue   Stairs             Wheelchair Mobility    Modified Rankin (Stroke Patients Only)       Balance Overall balance assessment: Needs assistance Sitting-balance support: No upper extremity supported;Feet supported Sitting balance-Leahy Scale: Good     Standing balance support: Bilateral upper extremity supported Standing balance-Leahy Scale: Poor Standing balance comment: reliant on UE support of RW                            Cognition Arousal/Alertness: Awake/alert Behavior During Therapy: WFL for tasks assessed/performed Overall Cognitive Status: Within Functional Limits for tasks assessed                                        Exercises      General Comments General comments (skin integrity, edema, etc.): pt intermittently tachycardic into 120s      Pertinent Vitals/Pain Pain Assessment: 0-10 Pain Score: 1  Pain Location: neck Pain Descriptors / Indicators: Sore Pain  Intervention(s): Monitored during session    Home Living                      Prior Function            PT Goals (current goals can now be found in the care plan section) Acute Rehab PT Goals Patient Stated Goal: walk better Progress towards PT goals: Progressing toward goals    Frequency    Min 5X/week      PT Plan Current plan remains appropriate    Co-evaluation               AM-PAC PT "6 Clicks" Mobility   Outcome Measure  Help needed turning from your back to your side while in a flat bed without using bedrails?: A Little Help needed moving from lying on your back to sitting on the side of a flat bed without using bedrails?: A Little Help needed moving to and from a bed to a chair (including a wheelchair)?: A Little Help needed standing up from a chair using your arms (e.g., wheelchair or bedside chair)?: A Little Help needed to walk in hospital room?: A Little Help needed climbing 3-5 steps with a railing? : A Lot 6 Click Score: 17    End of Session Equipment Utilized During Treatment: Back brace;Gait belt Activity Tolerance: Patient tolerated treatment well Patient left: in chair;with call bell/phone within reach;with family/visitor present Nurse Communication: Mobility status PT Visit Diagnosis: Other abnormalities of gait and mobility (R26.89);Pain Pain - Right/Left: Right Pain - part of body: Leg     Time: 1128-1207 PT Time Calculation (min) (ACUTE ONLY): 39 min  Charges:  $Gait Training: 23-37 mins $Therapeutic Activity: 8-22 mins                     Arlyss Gandy, PT, DPT Acute Rehabilitation Pager: [phone]    Arlyss Gandy 06/10/2020, 1:33 PM

## 2020-06-11 ENCOUNTER — Encounter (HOSPITAL_COMMUNITY): Payer: Self-pay | Admitting: Neurosurgery

## 2020-06-11 MED ORDER — HYDROCODONE-ACETAMINOPHEN 5-325 MG PO TABS
1.0000 | ORAL_TABLET | Freq: Four times a day (QID) | ORAL | 0 refills | Status: AC | PRN
Start: 2020-06-11 — End: 2020-06-18

## 2020-06-11 MED ORDER — AMLODIPINE BESYLATE 10 MG PO TABS
20.0000 mg | ORAL_TABLET | Freq: Every day | ORAL | Status: DC
  Administered 2020-06-11: 20 mg via ORAL
  Filled 2020-06-11: qty 2

## 2020-06-11 MED ORDER — METHOCARBAMOL 500 MG PO TABS: 500.0000 mg | ORAL_TABLET | Freq: Three times a day (TID) | ORAL | 0 refills | Status: AC | PRN

## 2020-06-11 MED ORDER — AMLODIPINE BESYLATE 10 MG PO TABS: 20.0000 mg | ORAL_TABLET | Freq: Every day | ORAL | Status: AC

## 2020-06-11 NOTE — Discharge Summary (Signed)
Physician Discharge Summary  Patient ID: Randy Stewart MRN: [id number] DOB/AGE: 08/29/1970 49 y.o.  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 male initially seen in the emergency department after presenting with inability to ambulate for a few days after suffering a fall.  He actually reports approximately 1-1/2 years of symptoms consistent with myelopathy related to a previous fall at work about a year and a half ago during which he also broke his ankle.  His work-up in the emergency department included MRI of the lumbar, thoracic, and cervical spine.  While he had minimal symptoms in the upper extremities with the exception of hyperreflexia, he did have significant proximal lower extremity weakness.  MRI demonstrated tandem stenosis at C4-C6 and T11-T12.  It was felt that the thoracic stenosis was symptomatic and he therefore underwent T11-T12 laminectomy for decompression.  He noted significant symptomatic improvement postoperatively, and 2 days later underwent anterior cervical discectomy and fusion at C4-5 and C5-6.  He did well postoperatively with continued improvement.  He was tolerating diet.  Physical therapy evaluation indicated he would be safe to go home with outpatient therapy.  He was therefore discharged in stable condition.  Treatments: Surgery -  1.  T11-T12 decompressive laminectomy 2.  Anterior cervical discectomy and fusion C4-5, C5-6  Discharge Exam: Blood pressure (!) 185/82, pulse (!) 49, temperature 98.2 F (36.8 C), temperature source Oral, resp. rate 18, SpO2 97 %. Awake, alert, oriented Speech fluent, appropriate CN grossly intact 5/5 BUE/BLE x minimal proximal LE weakness Wounds c/d/i  Disposition: Discharge disposition: 01-Home or  Self Care       Discharge Instructions    Call MD for:  redness, tenderness, or signs of infection (pain, swelling, redness, odor or green/yellow discharge around incision site)   Complete by: As directed    Call MD for:  temperature >100.4   Complete by: As directed    Diet - low sodium heart healthy   Complete by: As directed    Discharge instructions   Complete by: As directed    Walk at home as much as possible, at least 4 times / day   Increase activity slowly   Complete by: As directed    Lifting restrictions   Complete by: As directed    No lifting > 10 lbs   May shower / Bathe   Complete by: As directed    48 hours after surgery   May walk up steps   Complete by: As directed    No dressing needed   Complete by: As directed    Other Restrictions   Complete by: As directed    No bending/twisting at waist     Allergies as of 06/11/2020      Reactions   Lisinopril       Medication List    TAKE these medications   amLODipine 10 MG tablet Commonly known as: NORVASC Take 2 tablets (20 mg total) by mouth daily. What changed: how much to take   hydrochlorothiazide 12.5 MG capsule Commonly known as: MICROZIDE Take 12.5 mg by mouth daily.   HYDROcodone-acetaminophen 5-325 MG tablet Commonly known as: NORCO/VICODIN Take 1-2 tablets by mouth every 6 (six) hours as needed for up to 7 days for moderate pain or severe pain ((score 4 to 6)).  ibuprofen 800 MG tablet Commonly known as: ADVIL Take 800 mg by mouth every 8 (eight) hours as needed for moderate pain.   methocarbamol 500 MG tablet Commonly known as: ROBAXIN Take 1 tablet (500 mg total) by mouth every 8 (eight) hours as needed for muscle spasms.   MULTIVITAMIN ADULTS 50+ PO Take 1 tablet by mouth once a week.            Discharge Care Instructions  (From admission, onward)         Start     Ordered   06/11/20 0000  No dressing needed        06/11/20 1607          Follow-up Information     Lisbeth Renshaw, MD. Call in 2 week(s).   Specialty: Neurosurgery Contact information: [address] [place] [address] [phone]               Signed: Jackelyn Hoehn 06/11/2020, 8:44 AM

## 2020-06-11 NOTE — Progress Notes (Signed)
Physical Therapy Treatment Patient Details Name: Randy Stewart MRN: [id number] DOB: Jul 04, 1971 Today's Date: 06/11/2020    History of Present Illness 49 y.o. male with history of HTN who presented to the ED with several day history of bilateral proximal LE weakness. H/o fall at work in 2019 with left ankle fracture requiring operative repair. Difficulty ambulating since 2/2 RLE weakness. Brought to ED after fall while at football game. lumbar MRI and ultimately thoracic MRI which revealed moderate spinal stenosis at T11-12 with associated T2 signal changes. 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 Also with cervical stenosis, s/p ACDF on 11/5.    PT Comments    The pt is continuing to progress with mobility and PT goals at this time. He was able to complete multiple transfers from slightly lowered surfaces as well as long bout of hallway ambulation with use of RW and minG for safety. The pt continues to report limited confidence in his LE strength and mobility, and therefore declined opportunity to participate in stair training at this time. The pt was educated on HEP of LE stretches and walking program for home to improve mobility and endurance at home. The pt and his wife were agreeable to all education, and report they have no further questions regarding equipment, techniques, or safety at home and are safe to d/c home at this time with plan for continued therapies to improve LE strength and reduce reliance on AD for stability.    Follow Up Recommendations  Outpatient PT;Supervision for mobility/OOB     Equipment Recommendations  Rolling walker with 5" wheels;3in1 (PT)    Recommendations for Other Services       Precautions / Restrictions Precautions Precautions: Back;Fall Precaution Booklet Issued: No Precaution Comments: pt able to recall all back and neck precautions Required Braces or Orthoses: Spinal Brace Spinal Brace: Lumbar corset Restrictions Weight Bearing  Restrictions: No Other Position/Activity Restrictions: per [employer] via messaging in EPIC pt ok for full RLE weightbearing     Mobility  Bed Mobility Overal bed mobility: Needs Assistance Bed Mobility: Rolling;Sidelying to Sit Rolling: Min guard Sidelying to sit: Min guard       General bed mobility comments: pt received and left in recliner  Transfers Overall transfer level: Needs assistance Equipment used: Rolling walker (2 wheeled) Transfers: Sit to/from Stand Sit to Stand: Min guard         General transfer comment: WIth cues for hand placement, pt required very little help.  Ambulation/Gait Ambulation/Gait assistance: Min guard Gait Distance (Feet): 400 Feet Assistive device: Rolling walker (2 wheeled) Gait Pattern/deviations: Step-through pattern Gait velocity: 0.16 m/s Gait velocity interpretation: <1.8 ft/sec, indicate of risk for recurrent falls General Gait Details: pt with steady step through gait, PT notes some quivering of R quad during stance phase with fatigue   Stairs - pt declined                 Balance Overall balance assessment: Needs assistance Sitting-balance support: No upper extremity supported;Feet supported Sitting balance-Leahy Scale: Good Sitting balance - Comments: able to sit without UE support   Standing balance support: Bilateral upper extremity supported Standing balance-Leahy Scale: Poor Standing balance comment: reliant on UE support of RW                            Cognition Arousal/Alertness: Awake/alert Behavior During Therapy: WFL for tasks assessed/performed Overall Cognitive Status: Within Functional Limits for tasks assessed  General Comments: Slight decrease in safety awareness as it relates to preparing for transfers from low surfaces and need for assist/strengthening to facilitate these transitions. Agreeable to education      Exercises       General Comments General comments (skin integrity, edema, etc.): Pt doing well with adls and wife able to assist as needed.      Pertinent Vitals/Pain Pain Assessment: Faces Pain Score: 1  Faces Pain Scale: Hurts a little bit Pain Location: back Pain Descriptors / Indicators: Sore Pain Intervention(s): Premedicated before session;Monitored during session;Repositioned           PT Goals (current goals can now be found in the care plan section) Acute Rehab PT Goals Patient Stated Goal: walk better PT Goal Formulation: With patient/family Time For Goal Achievement: 06/21/20 Potential to Achieve Goals: Good Progress towards PT goals: Progressing toward goals    Frequency    Min 5X/week      PT Plan Current plan remains appropriate       AM-PAC PT "6 Clicks" Mobility   Outcome Measure  Help needed turning from your back to your side while in a flat bed without using bedrails?: None Help needed moving from lying on your back to sitting on the side of a flat bed without using bedrails?: None Help needed moving to and from a bed to a chair (including a wheelchair)?: A Little Help needed standing up from a chair using your arms (e.g., wheelchair or bedside chair)?: A Little Help needed to walk in hospital room?: A Little Help needed climbing 3-5 steps with a railing? : A Lot 6 Click Score: 19    End of Session Equipment Utilized During Treatment: Back brace;Gait belt Activity Tolerance: Patient tolerated treatment well Patient left: in chair;with call bell/phone within reach;with family/visitor present Nurse Communication: Mobility status PT Visit Diagnosis: Other abnormalities of gait and mobility (R26.89);Pain Pain - Right/Left: Right Pain - part of body: Leg     Time: 0086-7619 PT Time Calculation (min) (ACUTE ONLY): 29 min  Charges:  $Gait Training: 23-37 mins                     Rolm Baptise, PT, DPT   Acute Rehabilitation Department Pager #: [phone]   Gaetana Michaelis 06/11/2020, 12:18 PM

## 2020-06-11 NOTE — TOC Transition Note (Addendum)
Transition of Care (TOC) - CM/SW Discharge Note Donn Pierini RN,BSN Transitions of Care Unit 4NP (non trauma) - RN Case Manager See Treatment Team for direct Phone #   Patient Details  Name: Randy Stewart MRN: [id number] Date of Birth: [birth year]  Transition of Care [facility]) CM/SW Contact:  Darrold Span, RN Phone Number: 06/11/2020, 11:32 AM   Clinical Narrative:    Pt stable for transition home today, wife present at the bedside and will transport home.  Noted recs and MD note for outpt PT/OT. Spoke with pt and wife at bedside regarding best location for outpt therapy referral. They have decided on [address] location. Pt states he has RW at home, asked about elevated toilet "cusion/seat"- educated them on where to purchase if needed.  Referral done for outpt PT/OT needs to [facility] outpt rehab- [address] location via epic.   1205- received call from unit [facility]- regarding DME need for discharge- Jeanice Lim is recommending a 3n1 this AM for home- will place order per MD and call made to [employer] for DME need- (spoke with [facility])- 3n1 to be delivered to room prior to discharge.    Final next level of care: OP Rehab Barriers to Discharge: No Barriers Identified   Patient Goals and CMS Choice Patient states their goals for this hospitalization and ongoing recovery are:: return home and get back to normal CMS Medicare.gov Compare Post Acute Care list provided to:: Patient Choice offered to / list presented to : Patient  Discharge Placement               Home        Discharge Plan and Services   Discharge Planning Services: CM Consult            DME Arranged: N/A- 3n1 DME Agency: NA - [facility]       HH Arranged: NA HH Agency: NA        Social Determinants of Health (SDOH) Interventions     Readmission Risk Interventions Readmission Risk Prevention Plan 06/11/2020  Post Dischage Appt Complete  Medication Screening Complete  Transportation Screening  Complete  Some recent data might be hidden

## 2020-06-11 NOTE — Progress Notes (Signed)
Occupational Therapy Treatment Patient Details Name: Randy Stewart MRN: [id number] DOB: Jul 06, 1971 Today's Date: 06/11/2020    History of present illness 49 y.o. male with history of HTN who presented to the ED with several day history of bilateral proximal LE weakness. H/o fall at work in 2019 with left ankle fracture requiring operative repair. Difficulty ambulating since 2/2 RLE weakness. Brought to ED after fall while at football game. lumbar MRI and ultimately thoracic MRI which revealed moderate spinal stenosis at T11-12 with associated T2 signal changes. 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 Also with cervical stenosis, s/p ACDF on 11/5.   OT comments  Pt making great improvements and ready for d/c home with his wife later today. Pt with concerns regarding toileting.  Transferred to comfort commode with difficulty so attempted a transfer to 3:1 over commode with no assistance needed.  Contacted case manager to order 3:1 for 305# pt to dc home with to increase independence with toileting tasks at home and to use in shower when fatigued with standing.  Feel this will improve safety at d/c.  Will continue with set goals if pt does not d/c home today.   Follow Up Recommendations  Outpatient OT;Home health OT;Supervision - Intermittent    Equipment Recommendations  3 in 1 bedside commode    Recommendations for Other Services      Precautions / Restrictions Precautions Precautions: Back;Fall Precaution Booklet Issued: No Precaution Comments: pt able to recall all back and neck precautions Required Braces or Orthoses: Spinal Brace Spinal Brace: Lumbar corset Restrictions Weight Bearing Restrictions: No Other Position/Activity Restrictions: per [employer] via messaging in EPIC pt ok for full RLE weightbearing        Mobility Bed Mobility Overal bed mobility: Needs Assistance Bed Mobility: Rolling;Sidelying to Sit Rolling: Min guard Sidelying to sit: Min  guard       General bed mobility comments: pt received and left in recliner  Transfers Overall transfer level: Needs assistance Equipment used: Rolling walker (2 wheeled) Transfers: Sit to/from Stand Sit to Stand: Min guard         General transfer comment: WIth cues for hand placement, pt required very little help.    Balance Overall balance assessment: Needs assistance Sitting-balance support: No upper extremity supported;Feet supported Sitting balance-Leahy Scale: Good Sitting balance - Comments: able to sit without UE support   Standing balance support: Bilateral upper extremity supported Standing balance-Leahy Scale: Poor Standing balance comment: reliant on UE support of RW                           ADL either performed or assessed with clinical judgement   ADL Overall ADL's : Needs assistance/impaired Eating/Feeding: Set up;Sitting   Grooming: Wash/dry hands;Wash/dry face;Oral care;Supervision/safety;Standing                   Toilet Transfer: Min Social research officer, government Details (indicate cue type and reason): Pt found an ease with transferring with the 3:1 commode over the top of the commode today.  Pt was not planning on using this but required no assist when using this.  Family instructed on how to change height of the commode. Toileting- Clothing Manipulation and Hygiene: Supervision/safety;Sit to/from stand;Cueing for compensatory techniques       Functional mobility during ADLs: Min guard;Rolling walker General ADL Comments: Pt completed household distance functional mobility at min guard level. Began education on strategies and AE for LB dressing and pericare.  Vision   Vision Assessment?: No apparent visual deficits   Perception     Praxis      Cognition Arousal/Alertness: Awake/alert Behavior During Therapy: WFL for tasks assessed/performed Overall Cognitive Status: Within Functional Limits for tasks  assessed                                 General Comments: Slight decrease in safety awareness as it relates to preparing for transfers from low surfaces and need for assist/strengthening to facilitate these transitions. Agreeable to education        Exercises     Shoulder Instructions       General Comments Pt doing well with adls and wife able to assist as needed.    Pertinent Vitals/ Pain       Pain Assessment: Faces Pain Score: 1  Faces Pain Scale: Hurts a little bit Pain Location: back Pain Descriptors / Indicators: Sore Pain Intervention(s): Premedicated before session;Monitored during session;Repositioned  Home Living                                          Prior Functioning/Environment              Frequency  Min 2X/week        Progress Toward Goals  OT Goals(current goals can now be found in the care plan section)  Progress towards OT goals: Progressing toward goals  Acute Rehab OT Goals Patient Stated Goal: walk better OT Goal Formulation: With patient/family Time For Goal Achievement: 06/21/20 Potential to Achieve Goals: Good ADL Goals Pt Will Perform Grooming: with modified independence;standing;sitting Pt Will Perform Lower Body Bathing: with modified independence;sit to/from stand;with adaptive equipment Pt Will Perform Lower Body Dressing: with modified independence;sit to/from stand;with adaptive equipment Pt Will Transfer to Toilet: with modified independence;ambulating Pt Will Perform Toileting - Clothing Manipulation and hygiene: with modified independence;sit to/from stand;with adaptive equipment Pt Will Perform Tub/Shower Transfer: Shower transfer;with supervision;ambulating;3 in 1;rolling walker Additional ADL Goal #1: Pt will complete bed mobility at min guard level to prepare for EOB/OOB ADLs.  Plan Discharge plan remains appropriate    Co-evaluation                 AM-PAC OT "6 Clicks" Daily  Activity     Outcome Measure   Help from another person eating meals?: None Help from another person taking care of personal grooming?: None Help from another person toileting, which includes using toliet, bedpan, or urinal?: A Little Help from another person bathing (including washing, rinsing, drying)?: A Little Help from another person to put on and taking off regular upper body clothing?: None Help from another person to put on and taking off regular lower body clothing?: A Little 6 Click Score: 21    End of Session Equipment Utilized During Treatment: Back brace;Rolling walker  OT Visit Diagnosis: Unsteadiness on feet (R26.81);Muscle weakness (generalized) (M62.81);History of falling (Z91.81);Pain Pain - Right/Left: Right Pain - part of body: Leg;Ankle and joints of foot   Activity Tolerance Patient tolerated treatment well   Patient Left in bed;with call bell/phone within reach;with family/visitor present   Nurse Communication Mobility status        Time: 2297-9892 OT Time Calculation (min): 24 min  Charges: OT General Charges $OT Visit: 1 Visit OT Treatments $Self Care/Home Management : 23-37  mins   Hope Budds 06/11/2020, 12:18 PM

## 2020-06-11 NOTE — Plan of Care (Signed)

## 2020-06-11 NOTE — Progress Notes (Signed)
  NEUROSURGERY PROGRESS NOTE   No issues overnight. Minimal neck/back pain. Tolerating diet. Ambulating well with PT.  EXAM:  BP (!) 185/82 (BP Location: Left Arm)   Pulse (!) 49   Temp 98.2 F (36.8 C) (Oral)   Resp 18   SpO2 97%   Awake, alert, oriented  Speech fluent, appropriate  CN grossly intact  Good strength BUE, minimal proximal LE weakness  IMPRESSION:  49 y.o. male POD# 3 s/p C4-6 ACDF, POD# 5 T11-12 laminectomy for myelopathy with significant improvement in preop symptoms.  PLAN: - D/C Home today with outpatient PT/OT - F/U in office in 2 weeks

## 2020-06-11 NOTE — Progress Notes (Signed)
Discharge instructions, RX's, and follow up appts explained and provided to patient and wife. Verbalized understanding, patient left floor via wheelchair accompanied by staff.   Lyonel Morejon, Kae Heller, RN

## 2020-06-20 ENCOUNTER — Other Ambulatory Visit: Payer: Self-pay

## 2020-06-20 ENCOUNTER — Ambulatory Visit: Payer: PRIVATE HEALTH INSURANCE | Attending: Neurosurgery | Admitting: Physical Therapy

## 2020-06-20 DIAGNOSIS — Z9889 Other specified postprocedural states: Secondary | ICD-10-CM | POA: Insufficient documentation

## 2020-06-20 DIAGNOSIS — R262 Difficulty in walking, not elsewhere classified: Secondary | ICD-10-CM | POA: Diagnosis present

## 2020-06-20 DIAGNOSIS — Z981 Arthrodesis status: Secondary | ICD-10-CM

## 2020-06-20 DIAGNOSIS — M6281 Muscle weakness (generalized): Secondary | ICD-10-CM

## 2020-06-20 NOTE — Patient Instructions (Signed)
Access Code: [email or web address]: [email or web address]: 11/17/2021Prepared by: Victorino Dike PaaExercises  Seated Scapular Retraction - 2 x daily - 7 x weekly - 2 sets - 10 reps - 5-10 hold  Seated Transversus Abdominis Bracing - 2 x daily - 7 x weekly - 2 sets - 10 reps - 5-10 hold  Seated Long Arc Quad - 2 x daily - 7 x weekly - 2 sets - 10 reps - 5-10 hold  Supine Quad Set - 2 x daily - 7 x weekly - 2 sets - 10 reps - 5-10 hold  Supine Transversus Abdominis Bracing - Hands on Stomach - 2 x daily - 7 x weekly - 2 sets - 10 reps - 5-10 hold  Supine Active Straight Leg Raise - 1 x daily - 7 x weekly - 2 sets - 10 reps - 30 hold  Supine Bridge - 1 x daily - 7 x weekly - 2 sets - 10 reps - 30 hold

## 2020-06-21 NOTE — Therapy (Signed)
[facility] [address] [place], [place], [address] Phone: [phone]   Fax:  [phone]  Physical Therapy Evaluation  Patient Details  Name: Randy Stewart MRN: [id number] Date of Birth: 1971-03-24 Referring Provider (PT): Dr. Lisbeth Renshaw   Encounter Date: 06/20/2020   PT End of Session - 06/20/20 1504    Visit Number 1    Number of Visits 16    Date for PT Re-Evaluation 08/15/20    PT Start Time 1446    PT Stop Time 1530    PT Time Calculation (min) 44 min    Activity Tolerance Patient tolerated treatment well    Behavior During Therapy [facility] for tasks assessed/performed           Past Medical History:  Diagnosis Date   Anxiety    Hypertension     Past Surgical History:  Procedure Laterality Date   ANTERIOR CERVICAL DECOMP/DISCECTOMY FUSION N/A 06/08/2020   Procedure: Anterior Cervical Discectomy Fusion Cervical Four-Five, Cervical Five-Six;  Surgeon: Lisbeth Renshaw, MD;  Location: [facility] OR;  Service: Neurosurgery;  Laterality: N/A;   LUMBAR LAMINECTOMY/DECOMPRESSION MICRODISCECTOMY N/A 06/06/2020   Procedure: LUMBAR LAMINECTOMY THORACIC ELEVEN-TWELVE;  Surgeon: Lisbeth Renshaw, MD;  Location: [facility] OR;  Service: Neurosurgery;  Laterality: N/A;   NO PAST SURGERIES     ORIF ANKLE FRACTURE Left 11/30/2018   Procedure: OPEN REDUCTION INTERNAL FIXATION (ORIF) left ankle lateral malleolar fracture;  Surgeon: Toni Arthurs, MD;  Location: [facility];  Service: Orthopedics;  Laterality: Left;     There were no vitals filed for this visit.    Subjective Assessment - 06/20/20 1457    Subjective Pt presents following both a lumbar laminectomy and then 2 days later, a cervical fusion. He describes a progressive weakness in Rt LE following a L ankle injury with surgery in 2019.  He recently fell and was unable to walk, had severe pain, numbness and weakness until his surgery.  He was discharged  without HHPT. He uses a walker for ambulation.  His pain is well controlled.  He has difficulty walking, transfering sit to stand and needs basic set up assist for ADLs.  I had no idea my spine was the reason I was so weak.    Patient is accompained by: Family member   wife   Pertinent History L ankle ORIF 2019.  T11-T12 laminectomy .  Cervical fusion C4-C6    Limitations Sitting;Walking;Lifting;House hold activities;Standing    How long can you sit comfortably? no pain    How long can you stand comfortably? has not pushed this beyond household    How long can you walk comfortably? limited by L knee    Diagnostic tests Pre and post operative    Patient Stated Goals Get stronger and  get back to work.    Currently in Pain? Yes    Pain Score 2     Pain Location Neck    Pain Orientation Right    Pain Descriptors / Indicators Tightness;Sore    Pain Type Surgical pain    Pain Radiating Towards Rt shoulder, neck (upper trap)    Pain Onset 1 to 4 weeks ago    Pain Frequency Intermittent    Aggravating Factors  moving the wrong way    Pain Relieving Factors rest    Effect of Pain on Daily Activities pain is minimal    Multiple Pain Sites Yes    Pain Score 2    Pain Location Back  Pain Orientation Mid;Lower    Pain Descriptors / Indicators Aching;Tightness    Pain Type Surgical pain    Pain Onset 1 to 4 weeks ago    Pain Frequency Intermittent    Aggravating Factors  moving the wrong way    Pain Relieving Factors rest    Effect of Pain on Daily Activities limited by weakness more than pain             [facility] PT Assessment - 06/21/20 0001      Assessment   Medical Diagnosis Lumbar laminectomy, cervical fusion     Referring Provider (PT) Dr. Lisbeth RenshawNeelesh Nundkumar    Onset Date/Surgical Date 06/08/20    Hand Dominance Right    Next MD Visit 06/25/20    Prior Therapy No, in hospital acute       Precautions   Precautions Cervical;Back    Precaution Comments no lifting twisting bending  , lifting < 5 lbs overhead     Required Braces or Orthoses --   hasback and soft collar but did not wear to PT      Restrictions   Weight Bearing Restrictions No      Balance Screen   Has the patient fallen in the past 6 months Yes    How many times? 3    Has the patient had a decrease in activity level because of a fear of falling?  Yes    Is the patient reluctant to leave their home because of a fear of falling?  Yes      Home Environment   Living Environment Private residence    Living Arrangements Spouse/significant other    Type of Home House    Home Access Stairs to enter    Entrance Stairs-Number of Steps 0    Home Layout Multi-level    Home Equipment Walker - standard;Shower seat    Additional Comments needs to find wheels (has from previous ankle surgery)      Prior Function   Level of Independence Needs assistance with ADLs;Needs assistance with homemaking;Needs assistance with transfers    Vocation Full time employment    CounsellorVocation Requirements material handler , lifting and packing, driving forklift     Leisure kids, sports       Cognition   Overall Cognitive Status Within Functional Limits for tasks assessed      Observation/Other Assessments   Focus on Therapeutic Outcomes (FOTO)  30% ability       Sensation   Light Touch Impaired by gross assessment    Additional Comments legs have a tingling sensation as well as fingertips       Coordination   Gross Motor Movements are Fluid and Coordinated Not tested      Posture/Postural Control   Posture/Postural Control Postural limitations    Postural Limitations Rounded Shoulders;Increased thoracic kyphosis;Posterior pelvic tilt    Posture Comments sitting unsupported no brace       PROM   Overall PROM Comments hips WFL, tight in Internal rotation, shoulders  [facility]       Strength   Right Shoulder Flexion 3/5    Right Shoulder ABduction 3/5    Left Shoulder Flexion 3+/5    Left Shoulder ABduction 3+/5    Right  Hand Grip (lbs) 31    Left Hand Grip (lbs) 77    Right Hip Flexion 3-/5    Left Hip Flexion 3/5    Right Knee Flexion 4/5    Right Knee Extension 4/5  Left Knee Flexion 5/5    Left Knee Extension 5/5    Right Ankle Dorsiflexion 4-/5    Left Ankle Dorsiflexion 5/5      Flexibility   Hamstrings Rt 50 deg , Lt.  70 (passive SLR)       Palpation   Palpation comment Rt upper trap sore, tender near incision in T-L spine       Special Tests   Other special tests None       Transfers   Five time sit to stand comments  17 sec hands on mat table, elevated to have hips at 90 deg to walker       Ambulation/Gait   Ambulation Distance (Feet) 100 Feet    Assistive device Standard walker    Gait Pattern Step-through pattern           Objective measurements completed on examination: See above findings.    Pt education: HEP on [employer] (sent post eval) Eval findings Safety with walker, gait Nerve pain Back precautions Wear neck and back brace to PT and follow MD advice.  Pt concerned about urgency of bowel and bladder post surgical, improving.  Discussed loss of control vs urgency and other red flags.  Core , bed mobility.    PT Short Term Goals - 06/21/20 1431      PT SHORT TERM GOAL #1   Title Pt will be able to show independence with HEP for LE and core strengthening    Baseline given on eval    Time 4    Period Weeks    Status New    Target Date 07/18/20      PT SHORT TERM GOAL #2   Title Pt will understand FOTO and potential to improve his condition within 3 visits    Time 2    Period Weeks    Status New    Target Date 07/18/20      PT SHORT TERM GOAL #3   Title Pt will consistently wear back brace and follow precuations as directed for safety with community mobility    Time 4    Period Weeks    Status New    Target Date 07/18/20      PT SHORT TERM GOAL #4   Title Pt will be able to walk without increased knee pain and improved stride length , LRAD    Time  4    Period Weeks    Status New    Target Date 07/18/20             PT Long Term Goals - 06/21/20 1433      PT LONG TERM GOAL #1   Title Pt will improve FOTO to 58% ability or greater for improved functional mobility    Time 8    Period Weeks    Status New    Target Date 08/15/20      PT LONG TERM GOAL #2   Title Pt will be able to ambulate without a walker (cane) community distances with mod I    Time 8    Period Weeks    Status New    Target Date 08/15/20      PT LONG TERM GOAL #3   Title Pt will be able to increase UE and LE strength to 4+/5 to 5/5 for maximal functional mobility and strength    Time 8    Period Weeks    Status New    Target Date 08/15/20  PT LONG TERM GOAL #4   Title Pt will be able to perform 5 x STS from standard chair, no UE needed in 15 sec or less    Baseline elevated mat, 17 sec with UE to walker    Time 8    Period Weeks    Status New    Target Date 08/15/20      PT LONG TERM GOAL #5   Title Pt will be I with final HEP upon discharge from PT    Time 8    Period Weeks    Status New    Target Date 08/15/20                  Plan - 06/20/20 2054    Clinical Impression Statement Randy Stewart presents for low complexity eval of both a cervical fusion C4-C6 and lumbar (T11-T12)  laminectomy within days due to spinal cord compression.  He is overall progressing with his mobility requiring set up, supervision for safety.  He did not wear any brace today which is concerning, he did realize this but did not want to be late to PT. He shows mostly Rt sided UE andd LE weakness, some general deconditioning.  Rt sided nerve tension along LE, pain is minimal and mostly incisional.  He will do well with PT and should progress without complications.    Personal Factors and Comorbidities Profession;Comorbidity 3+    Comorbidities anxiety, HTN, obesity    Examination-Activity Limitations Bathing;Dressing;Sit;Transfers;Sleep;Hygiene/Grooming;Bed  Mobility;Bend;Lift;Squat;Stairs;Geophysical data processor for [employer];Reach Overhead;Stand;Toileting    Examination-Participation Restrictions Church;Interpersonal Relationship;Occupation;Cleaning;Laundry;Community Activity;Driving;Meal Prep;Shop;Yard Work    Stability/Clinical Decision Making Stable/Uncomplicated    Clinical Decision Making Low    Rehab Potential Excellent    PT Frequency 3x / week   2-3 times depending on transport   PT Duration 8 weeks    PT Treatment/Interventions ADLs/Self Care Home Management;Cryotherapy;Gait training;Therapeutic exercise;Patient/family education;Taping;Manual techniques;Stair training;Balance training;Functional mobility training;Neuromuscular re-education;Passive range of motion;Therapeutic activities;DME Instruction    PT Next Visit Plan HEP.  Wheels for walker? Wearing brace?    PT Home Exercise Plan [email or web address]    Consulted and Agree with Plan of Care Patient           Patient will benefit from skilled therapeutic intervention in order to improve the following deficits and impairments:  Decreased activity tolerance, Decreased knowledge of use of DME, Decreased safety awareness, Decreased strength, Impaired flexibility, Impaired UE functional use, Pain, Obesity, Improper body mechanics, Decreased range of motion, Decreased scar mobility, Decreased knowledge of precautions, Decreased balance, Decreased endurance, Decreased mobility, Decreased skin integrity, Difficulty walking, Impaired sensation  Visit Diagnosis: Status post cervical spinal fusion  Status post lumbar laminectomy  Muscle weakness (generalized)  Difficulty in walking, not elsewhere classified     Problem List Patient Active Problem List   Diagnosis Date Noted   Thoracic myelopathy 06/05/2020    Randy Stewart 06/21/2020, 3:00 PM  [facility] [address] [place], [place], [address] Phone: [phone]   Fax:   [phone]  Name: Randy Stewart MRN: [id number] Date of Birth: 04/01/1971

## 2020-06-26 ENCOUNTER — Ambulatory Visit: Payer: PRIVATE HEALTH INSURANCE

## 2020-06-26 ENCOUNTER — Other Ambulatory Visit: Payer: Self-pay

## 2020-06-26 DIAGNOSIS — Z981 Arthrodesis status: Secondary | ICD-10-CM

## 2020-06-26 DIAGNOSIS — R262 Difficulty in walking, not elsewhere classified: Secondary | ICD-10-CM

## 2020-06-26 DIAGNOSIS — M6281 Muscle weakness (generalized): Secondary | ICD-10-CM

## 2020-06-26 DIAGNOSIS — Z9889 Other specified postprocedural states: Secondary | ICD-10-CM

## 2020-06-26 NOTE — Therapy (Signed)
[facility]patient Rehabilitation [facility] [address] [place], [place], [address] Phone: [phone]   Fax:  [phone]  Physical Therapy Treatment  Patient Details  Name: Randy Stewart MRN: [id number]  Date of Birth: Dec 28, 1970 Referring Provider (PT): Dr. Lisbeth Renshaw   Encounter Date: 06/26/2020   PT End of Session - 06/26/20 1955    Visit Number 2    Number of Visits 16    Date for PT Re-Evaluation 08/15/20    Authorization Type Generic Commercial    PT Start Time 1445    PT Stop Time 1528    PT Time Calculation (min) 43 min    Activity Tolerance Patient tolerated treatment well    Behavior During Therapy [facility] for tasks assessed/performed           Past Medical History:  Diagnosis Date  . Anxiety   . Hypertension     Past Surgical History:  Procedure Laterality Date  . ANTERIOR CERVICAL DECOMP/DISCECTOMY FUSION N/A 06/08/2020   Procedure: Anterior Cervical Discectomy Fusion Cervical Four-Five, Cervical Five-Six;  Surgeon: Lisbeth Renshaw, MD;  Location: [facility] OR;  Service: Neurosurgery;  Laterality: N/A;  . LUMBAR LAMINECTOMY/DECOMPRESSION MICRODISCECTOMY N/A 06/06/2020   Procedure: LUMBAR LAMINECTOMY THORACIC ELEVEN-TWELVE;  Surgeon: Lisbeth Renshaw, MD;  Location: [facility] OR;  Service: Neurosurgery;  Laterality: N/A;  . NO PAST SURGERIES    . ORIF ANKLE FRACTURE Left 11/30/2018   Procedure: OPEN REDUCTION INTERNAL FIXATION (ORIF) left ankle lateral malleolar fracture;  Surgeon: Toni Arthurs, MD;  Location: [facility];  Service: Orthopedics;  Laterality: Left;     There were no vitals filed for this visit.   Subjective Assessment - 06/26/20 1449    Subjective "I had a follow up appointment yesterday and they said I don't have to wear the braces anymore because everything is looking good. I have been bending and twisting a little bit, but I am still not lifting anything more than 5 lbs above my head" when asked about  braces and precautions.    Patient is accompained by: Family member   wife   Pertinent History L ankle ORIF 2019.  T11-T12 laminectomy .  Cervical fusion C4-C6    Limitations Sitting;Walking;Lifting;House hold activities;Standing    How long can you sit comfortably? no pain    How long can you stand comfortably? has not pushed this beyond household    How long can you walk comfortably? limited by L knee    Diagnostic tests Pre and post operative    Patient Stated Goals Get stronger and  get back to work.    Currently in Pain? Yes    Pain Score 2     Pain Location Neck    Pain Orientation Left    Pain Descriptors / Indicators Tightness    Pain Type Surgical pain    Pain Radiating Towards L shoulder; neck (B upper trap)    Pain Onset 1 to 4 weeks ago    Pain Onset 1 to 4 weeks ago              [facility] PT Assessment - 06/26/20 0001      Assessment   Medical Diagnosis Lumbar laminectomy, cervical fusion     Referring Provider (PT) Dr. Lisbeth Renshaw                         [facility] Adult PT Treatment/Exercise - 06/26/20 0001      Self-Care   Self-Care  Other Self-Care Comments    Other Self-Care Comments  Pt education provided regarding spinal precautions (cervical and lumbar), self-myofascial release with thera-cane, provided printout for thera-cane and where to purchase, updated HEP      Exercises   Exercises Lumbar;Shoulder;Knee/Hip      Lumbar Exercises: Seated   Sit to Stand 20 reps    Sit to Stand Limitations Seated on chair with airex for elevated surface    Other Seated Lumbar Exercises Posterior pelvic tilt x 20      Knee/Hip Exercises: Seated   Long Arc Quad Strengthening;Right;Left;20 reps    Long Arc Quad Limitations 3 second hold at end range knee EXT    Ball Squeeze 25x    Marching Strengthening;Both;2 sets;15 reps    Hamstring Curl Strengthening;Both;20 reps    Hamstring Limitations Red theraband      Manual Therapy   Manual Therapy Soft  tissue mobilization;Myofascial release    Manual therapy comments Demonstrated and had pt practice use of thera-cane for self-myofascial release    Soft tissue mobilization STM and myofascial release of B upper traps                  PT Education - 06/26/20 1953    Education Details Pt education provided regarding spinal precautions (cervical and lumbar), self-myofascial release with thera-cane, provided printout for thera-cane and where to purchase, updated HEP    Person(s) Educated Patient    Methods Explanation;Demonstration;Tactile cues;Verbal cues    Comprehension Verbalized understanding;Returned demonstration;Verbal cues required;Tactile cues required            PT Short Term Goals - 06/21/20 1431      PT SHORT TERM GOAL #1   Title Pt will be able to show independence with HEP for LE and core strengthening    Baseline given on eval    Time 4    Period Weeks    Status New    Target Date 07/18/20      PT SHORT TERM GOAL #2   Title Pt will understand FOTO and potential to improve his condition within 3 visits    Time 2    Period Weeks    Status New    Target Date 07/18/20      PT SHORT TERM GOAL #3   Title Pt will consistently wear back brace and follow precuations as directed for safety with community mobility    Time 4    Period Weeks    Status New    Target Date 07/18/20      PT SHORT TERM GOAL #4   Title Pt will be able to walk without increased knee pain and improved stride length , LRAD    Time 4    Period Weeks    Status New    Target Date 07/18/20             PT Long Term Goals - 06/21/20 1433      PT LONG TERM GOAL #1   Title Pt will improve FOTO to 58% ability or greater for improved functional mobility    Time 8    Period Weeks    Status New    Target Date 08/15/20      PT LONG TERM GOAL #2   Title Pt will be able to ambulate without a walker (cane) community distances with mod I    Time 8    Period Weeks    Status New    Target  Date 08/15/20  PT LONG TERM GOAL #3   Title Pt will be able to increase UE and LE strength to 4+/5 to 5/5 for maximal functional mobility and strength    Time 8    Period Weeks    Status New    Target Date 08/15/20      PT LONG TERM GOAL #4   Title Pt will be able to perform 5 x STS from standard chair, no UE needed in 15 sec or less    Baseline elevated mat, 17 sec with UE to walker    Time 8    Period Weeks    Status New    Target Date 08/15/20      PT LONG TERM GOAL #5   Title Pt will be I with final HEP upon discharge from PT    Time 8    Period Weeks    Status New    Target Date 08/15/20                 Plan - 06/26/20 1955    Clinical Impression Statement Patient presents to PT with no back brace and standard walker without wheels. Pt reports he ordered wheels and will have them soon for his walker. Pt experienced fatigue and frustration during interventions due to weakness that is especially apparent in RLE. PT encouraged pt that functional strengthening will take several weeks of consistently performing exercises and not to be discouraged that the interventions in PT are difficult. Pt verbalized understanding and was more compliant with trying to perform exercises to the best of his ability. Pt presents with TTP along B upper traps that was eased with myofascial release and STM.    Personal Factors and Comorbidities Profession;Comorbidity 3+    Comorbidities anxiety, HTN, obesity    Examination-Activity Limitations Bathing;Dressing;Sit;Transfers;Sleep;Hygiene/Grooming;Bed Mobility;Bend;Lift;Squat;Stairs;Geophysical data processor for [employer];Reach Overhead;Stand;Toileting    Examination-Participation Restrictions Church;Interpersonal Relationship;Occupation;Cleaning;Laundry;Community Activity;Driving;Meal Prep;Shop;Yard Work    Stability/Clinical Decision Making Stable/Uncomplicated    Rehab Potential Excellent    PT Frequency 3x / week   2-3 times depending on  transport   PT Duration 8 weeks    PT Treatment/Interventions ADLs/Self Care Home Management;Cryotherapy;Gait training;Therapeutic exercise;Patient/family education;Taping;Manual techniques;Stair training;Balance training;Functional mobility training;Neuromuscular re-education;Passive range of motion;Therapeutic activities;DME Instruction    PT Next Visit Plan HEP.  Wheels for walker? Progress BLE strengthening; gait training with RW if pt has obtained wheels, manual therapy for B upper traps if indicated    PT Home Exercise Plan [email or web address]    Consulted and Agree with Plan of Care Patient           Patient will benefit from skilled therapeutic intervention in order to improve the following deficits and impairments:  Decreased activity tolerance, Decreased knowledge of use of DME, Decreased safety awareness, Decreased strength, Impaired flexibility, Impaired UE functional use, Pain, Obesity, Improper body mechanics, Decreased range of motion, Decreased scar mobility, Decreased knowledge of precautions, Decreased balance, Decreased endurance, Decreased mobility, Decreased skin integrity, Difficulty walking, Impaired sensation  Visit Diagnosis: Status post cervical spinal fusion  Status post lumbar laminectomy  Muscle weakness (generalized)  Difficulty in walking, not elsewhere classified     Problem List Patient Active Problem List   Diagnosis Date Noted  . Thoracic myelopathy 06/05/2020      Rhea Bleacher, PT, DPT 06/26/20 8:07 PM  [facility] [address] [place], [place], [address] Phone: [phone]   Fax:  [phone]  Name: Randy Stewart MRN: [id number] Date of Birth: Aug 25, 1970

## 2020-07-09 ENCOUNTER — Other Ambulatory Visit: Payer: Self-pay

## 2020-07-09 ENCOUNTER — Ambulatory Visit: Payer: PRIVATE HEALTH INSURANCE | Attending: Neurosurgery

## 2020-07-09 DIAGNOSIS — Z9889 Other specified postprocedural states: Secondary | ICD-10-CM | POA: Diagnosis present

## 2020-07-09 DIAGNOSIS — R262 Difficulty in walking, not elsewhere classified: Secondary | ICD-10-CM | POA: Insufficient documentation

## 2020-07-09 DIAGNOSIS — M6281 Muscle weakness (generalized): Secondary | ICD-10-CM | POA: Diagnosis present

## 2020-07-09 DIAGNOSIS — Z981 Arthrodesis status: Secondary | ICD-10-CM | POA: Insufficient documentation

## 2020-07-09 NOTE — Therapy (Signed)
[facility] [address] [place], [place], [address] Phone: [phone]   Fax:  [phone]  Physical Therapy Treatment  Patient Details  Name: AZLAN HANWAY MRN: [id number] Date of Birth: [birth year] Referring Provider (PT): Dr. Lisbeth Renshaw   Encounter Date: 07/09/2020   PT End of Session - 07/09/20 1453    Visit Number 3    Number of Visits 16    Date for PT Re-Evaluation 08/15/20    Authorization Type Generic Commercial    PT Start Time 1445    PT Stop Time 1530    PT Time Calculation (min) 45 min    Activity Tolerance Patient tolerated treatment well    Behavior During Therapy [facility] for tasks assessed/performed           Past Medical History:  Diagnosis Date  . Anxiety   . Hypertension     Past Surgical History:  Procedure Laterality Date  . ANTERIOR CERVICAL DECOMP/DISCECTOMY FUSION N/A 06/08/2020   Procedure: Anterior Cervical Discectomy Fusion Cervical Four-Five, Cervical Five-Six;  Surgeon: Lisbeth Renshaw, MD;  Location: [facility] OR;  Service: Neurosurgery;  Laterality: N/A;  . LUMBAR LAMINECTOMY/DECOMPRESSION MICRODISCECTOMY N/A 06/06/2020   Procedure: LUMBAR LAMINECTOMY THORACIC ELEVEN-TWELVE;  Surgeon: Lisbeth Renshaw, MD;  Location: [facility] OR;  Service: Neurosurgery;  Laterality: N/A;  . NO PAST SURGERIES    . ORIF ANKLE FRACTURE Left 11/30/2018   Procedure: OPEN REDUCTION INTERNAL FIXATION (ORIF) left ankle lateral malleolar fracture;  Surgeon: Toni Arthurs, MD;  Location: [facility];  Service: Orthopedics;  Laterality: Left;     There were no vitals filed for this visit.   Subjective Assessment - 07/09/20 1450    Subjective "I have been up and moving around more, so my thighs hurt some. I'm getting stronger but the strength and mobility isn't where it needs to be yet. I don't have the full range in my right leg, but I can tell it's getting stronger."    Patient is accompained by:  Family member   wife   Pertinent History L ankle ORIF 2019.  T11-T12 laminectomy .  Cervical fusion C4-C6    Limitations Sitting;Walking;Lifting;House hold activities;Standing    How long can you sit comfortably? no pain    How long can you stand comfortably? has not pushed this beyond household    How long can you walk comfortably? limited by L knee    Diagnostic tests Pre and post operative    Patient Stated Goals Get stronger and  get back to work.    Currently in Pain? Yes    Pain Score 3     Pain Location Leg    Pain Orientation Right;Left    Pain Descriptors / Indicators Burning;Sore    Pain Onset 1 to 4 weeks ago    Pain Onset 1 to 4 weeks ago              [facility] PT Assessment - 07/09/20 0001      Assessment   Medical Diagnosis Lumbar laminectomy, cervical fusion     Referring Provider (PT) Dr. Lisbeth Renshaw      Special Tests   Other special tests Patient mentions pain in R wrist as he demonstrates active thumb extension. Pt reports pain during Finklestein's, consistent with De Quervain's potentially.                           [facility] Adult PT Treatment/Exercise - 07/09/20 0001  Lumbar Exercises: Aerobic   Nustep L3 x 6 min UE and LE (UE at 13 length)      Lumbar Exercises: Supine   Pelvic Tilt 20 reps    Bent Knee Raise 15 reps    Bent Knee Raise Limitations Alternating marches x 15 each LE while maintaining low back against mat (TrA activation)    Straight Leg Raise 10 reps    Straight Leg Raises Limitations 10x each LE with breaks as needed due to fatigue      Knee/Hip Exercises: Seated   Long Arc Quad Strengthening;Both;15 reps    Long Arc Quad Limitations 3 second hold at end range knee EXT      Shoulder Exercises: Seated   Retraction AROM;Strengthening;Both;20 reps      Hand Exercises   Other Hand Exercises Squeezing putty with R hand x 2 min    Other Hand Exercises R de Quervain's stretch 5 x 10 sec      Manual Therapy   Manual  Therapy Soft tissue mobilization;Myofascial release;Taping    Manual therapy comments Rocktape along R thumb (EBP, APL) trial to see if it helps ease patient's pain/discomfort in R lateral wrist    Soft tissue mobilization STM of B upper traps, R glute med, R piriformis, and R HS    Myofascial Release Myofascial release of B upper traps and R piriformis                  PT Education - 07/09/20 1746    Education Details Reviewed HEP and spinal precautions (cervical and lumbar). Provided pt with putty for R grip strength. Urged patient to remove Rocktape if any irritation or adverse effects occur.    Person(s) Educated Patient    Methods Explanation;Demonstration    Comprehension Verbalized understanding;Returned demonstration            PT Short Term Goals - 06/21/20 1431      PT SHORT TERM GOAL #1   Title Pt will be able to show independence with HEP for LE and core strengthening    Baseline given on eval    Time 4    Period Weeks    Status New    Target Date 07/18/20      PT SHORT TERM GOAL #2   Title Pt will understand FOTO and potential to improve his condition within 3 visits    Time 2    Period Weeks    Status New    Target Date 07/18/20      PT SHORT TERM GOAL #3   Title Pt will consistently wear back brace and follow precuations as directed for safety with community mobility    Time 4    Period Weeks    Status New    Target Date 07/18/20      PT SHORT TERM GOAL #4   Title Pt will be able to walk without increased knee pain and improved stride length , LRAD    Time 4    Period Weeks    Status New    Target Date 07/18/20             PT Long Term Goals - 06/21/20 1433      PT LONG TERM GOAL #1   Title Pt will improve FOTO to 58% ability or greater for improved functional mobility    Time 8    Period Weeks    Status New    Target Date 08/15/20  PT LONG TERM GOAL #2   Title Pt will be able to ambulate without a walker (cane) community  distances with mod I    Time 8    Period Weeks    Status New    Target Date 08/15/20      PT LONG TERM GOAL #3   Title Pt will be able to increase UE and LE strength to 4+/5 to 5/5 for maximal functional mobility and strength    Time 8    Period Weeks    Status New    Target Date 08/15/20      PT LONG TERM GOAL #4   Title Pt will be able to perform 5 x STS from standard chair, no UE needed in 15 sec or less    Baseline elevated mat, 17 sec with UE to walker    Time 8    Period Weeks    Status New    Target Date 08/15/20      PT LONG TERM GOAL #5   Title Pt will be I with final HEP upon discharge from PT    Time 8    Period Weeks    Status New    Target Date 08/15/20                 Plan - 07/09/20 1453    Clinical Impression Statement Patient presents to PT with wheels on his walker and step through gait pattern today. He demonstrated improved tolerance with interventions today compared to previous session but can further improve strength and endurance. Rocktape applied along R thumb due to patient's complaints of pain at R lateral wrist as he has been trying to progress his BUE strength at home as well. Pt has been trying to work on R grip strength at home in addition to BLE exercises. He will continue to benefit from skilled PT intervention to address R-sided weakness and improve function.    Personal Factors and Comorbidities Profession;Comorbidity 3+    Comorbidities anxiety, HTN, obesity    Examination-Activity Limitations Bathing;Dressing;Sit;Transfers;Sleep;Hygiene/Grooming;Bed Mobility;Bend;Lift;Squat;Stairs;Geophysical data processorLocomotion Level;Caring for [employer];Carry;Reach Overhead;Stand;Toileting    Examination-Participation Restrictions Church;Interpersonal Relationship;Occupation;Cleaning;Laundry;Community Activity;Driving;Meal Prep;Shop;Yard Work    Stability/Clinical Decision Making Stable/Uncomplicated    Rehab Potential Excellent    PT Frequency 3x / week   2-3 times  depending on transport   PT Duration 8 weeks    PT Treatment/Interventions ADLs/Self Care Home Management;Cryotherapy;Gait training;Therapeutic exercise;Patient/family education;Taping;Manual techniques;Stair training;Balance training;Functional mobility training;Neuromuscular re-education;Passive range of motion;Therapeutic activities;DME Instruction    PT Next Visit Plan HEP. Progress BLE strengthening; gait training with RW, RUE strengthening (grip strength), manual therapy for B upper traps if indicated    PT Home Exercise Plan [id number]    Consulted and Agree with Plan of Care Patient           Patient will benefit from skilled therapeutic intervention in order to improve the following deficits and impairments:  Decreased activity tolerance, Decreased knowledge of use of DME, Decreased safety awareness, Decreased strength, Impaired flexibility, Impaired UE functional use, Pain, Obesity, Improper body mechanics, Decreased range of motion, Decreased scar mobility, Decreased knowledge of precautions, Decreased balance, Decreased endurance, Decreased mobility, Decreased skin integrity, Difficulty walking, Impaired sensation  Visit Diagnosis: Status post cervical spinal fusion  Status post lumbar laminectomy  Muscle weakness (generalized)  Difficulty in walking, not elsewhere classified     Problem List Patient Active Problem List   Diagnosis Date Noted  . 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 [place], [place], [address] Phone: [phone]   Fax:  [phone]  Name: MARUICE PIERONI MRN: [id number] Date of Birth: 1971-05-20

## 2020-07-12 ENCOUNTER — Other Ambulatory Visit: Payer: Self-pay

## 2020-07-12 ENCOUNTER — Ambulatory Visit: Payer: PRIVATE HEALTH INSURANCE

## 2020-07-12 DIAGNOSIS — R262 Difficulty in walking, not elsewhere classified: Secondary | ICD-10-CM

## 2020-07-12 DIAGNOSIS — M6281 Muscle weakness (generalized): Secondary | ICD-10-CM

## 2020-07-12 DIAGNOSIS — Z981 Arthrodesis status: Secondary | ICD-10-CM

## 2020-07-12 DIAGNOSIS — Z9889 Other specified postprocedural states: Secondary | ICD-10-CM

## 2020-07-12 NOTE — Therapy (Signed)
[facility] Outpatient Rehabilitation [facility] [address] [place], [place], [address] Phone: [phone]   Fax:  [phone]  Physical Therapy Treatment  Patient Details  Name: Randy Stewart MRN: [id number] Date of Birth: September 17, 1970 Referring Provider (PT): Dr. Lisbeth Renshaw   Encounter Date: 07/12/2020   PT End of Session - 07/12/20 1454    Visit Number 4    Number of Visits 16    Date for PT Re-Evaluation 08/15/20    Authorization Type Generic Commercial    PT Start Time 1400    PT Stop Time 1443    PT Time Calculation (min) 43 min    Activity Tolerance Patient tolerated treatment well    Behavior During Therapy [facility] for tasks assessed/performed           Past Medical History:  Diagnosis Date  . Anxiety   . Hypertension     Past Surgical History:  Procedure Laterality Date  . ANTERIOR CERVICAL DECOMP/DISCECTOMY FUSION N/A 06/08/2020   Procedure: Anterior Cervical Discectomy Fusion Cervical Four-Five, Cervical Five-Six;  Surgeon: Lisbeth Renshaw, MD;  Location: [facility] OR;  Service: Neurosurgery;  Laterality: N/A;  . LUMBAR LAMINECTOMY/DECOMPRESSION MICRODISCECTOMY N/A 06/06/2020   Procedure: LUMBAR LAMINECTOMY THORACIC ELEVEN-TWELVE;  Surgeon: Lisbeth Renshaw, MD;  Location: [facility] OR;  Service: Neurosurgery;  Laterality: N/A;  . NO PAST SURGERIES    . ORIF ANKLE FRACTURE Left 11/30/2018   Procedure: OPEN REDUCTION INTERNAL FIXATION (ORIF) left ankle lateral malleolar fracture;  Surgeon: Toni Arthurs, MD;  Location: [facility];  Service: Orthopedics;  Laterality: Left;     There were no vitals filed for this visit.   Subjective Assessment - 07/12/20 1400    Subjective "I feel okay, just the same stiffness in my thighs". Pt denies pain in neck or back.    Patient is accompained by: Family member   wife   Pertinent History L ankle ORIF 2019.  T11-T12 laminectomy .  Cervical fusion C4-C6    Limitations  Sitting;Walking;Lifting;House hold activities;Standing    How long can you sit comfortably? no pain    How long can you stand comfortably? has not pushed this beyond household    How long can you walk comfortably? limited by L knee    Diagnostic tests Pre and post operative    Patient Stated Goals Get stronger and  get back to work.    Currently in Pain? Yes    Pain Score 3     Pain Location Leg    Pain Orientation Right;Left    Pain Descriptors / Indicators Tightness    Pain Onset 1 to 4 weeks ago    Pain Onset 1 to 4 weeks ago              [facility] PT Assessment - 07/12/20 0001      Assessment   Medical Diagnosis Lumbar laminectomy, cervical fusion     Referring Provider (PT) Dr. Lisbeth Renshaw    Onset Date/Surgical Date 06/08/20                         [facility] Adult PT Treatment/Exercise - 07/12/20 0001      Self-Care   Self-Care Other Self-Care Comments    Other Self-Care Comments  Reviewed HEP and spinal precautions (cervical and lumbar). Pt education provided regarding anatomy of spinal cord and nerve roots (skeleton model for demonstration) to better understand. Pt asked questions regarding how long it will be before his hands  and thighs do not feel numb during activity - pt confirms that he does not have any of these symptoms at rest. PT did not offer definite answer but advised pt to discuss symptoms with his doctor if symptoms persist regularly/worsen. When asked if his symptoms feel like a "shooting/numbness/tingling" or "muscles working" pt responds that "it's probably more the muscles working but it's hard to tell".      Lumbar Exercises: Aerobic   Nustep L4 x 6 min UE and LE (UE at level 13)      Lumbar Exercises: Supine   Ab Set 15 reps    AB Set Limitations Reaching towards toes with BUE    Pelvic Tilt 20 reps    Clam 20 reps    Clam Limitations red theraband    Bridge with [employer] 20 reps    Bridge with [employer] Limitations 2 x 10;  decreased stability with lean to each side especially with initial attempts - was able to demonstrated improved stability with increased reps      Knee/Hip Exercises: Supine   Quad Sets Strengthening;Both;20 reps    Quad Sets Limitations towel roll under knee    Straight Leg Raises AROM;Strengthening;Both;2 sets;10 reps    Straight Leg Raises Limitations quad lag      Shoulder Exercises: Supine   Other Supine Exercises Supine cervical retraction x 10      Manual Therapy   Manual Therapy Soft tissue mobilization;Taping;Passive ROM    Manual therapy comments Passive R hip FL stretch with pt in L sidelying; Rocktape along R thumb (EBP, APL) due to it easing patient's pain/discomfort in R lateral wrist    Soft tissue mobilization STM and myofascial release of R piriformis and glute med                  PT Education - 07/12/20 1450    Education Details Reviewed HEP and spinal precautions (cervical and lumbar). Pt education provided regarding anatomy of spinal cord and nerve roots (skeleton model for demonstration) to better understand. Pt asked questions regarding how long it will be before his hands and thighs do not feel numb during activity - pt confirms that he does not have any of these symptoms at rest. PT did not offer definite answer but advised pt to discuss symptoms with his doctor if symptoms persist regularly/worsen. When asked if his symptoms feel like a "shooting/numbness/tingling" or "muscles working" pt responds that "it's probably more the muscles working but it's hard to tell".    Person(s) Educated Patient    Methods Explanation;Demonstration    Comprehension Verbalized understanding;Returned demonstration            PT Short Term Goals - 06/21/20 1431      PT SHORT TERM GOAL #1   Title Pt will be able to show independence with HEP for LE and core strengthening    Baseline given on eval    Time 4    Period Weeks    Status New    Target Date 07/18/20      PT  SHORT TERM GOAL #2   Title Pt will understand FOTO and potential to improve his condition within 3 visits    Time 2    Period Weeks    Status New    Target Date 07/18/20      PT SHORT TERM GOAL #3   Title Pt will consistently wear back brace and follow precuations as directed for safety with community mobility  Time 4    Period Weeks    Status New    Target Date 07/18/20      PT SHORT TERM GOAL #4   Title Pt will be able to walk without increased knee pain and improved stride length , LRAD    Time 4    Period Weeks    Status New    Target Date 07/18/20             PT Long Term Goals - 06/21/20 1433      PT LONG TERM GOAL #1   Title Pt will improve FOTO to 58% ability or greater for improved functional mobility    Time 8    Period Weeks    Status New    Target Date 08/15/20      PT LONG TERM GOAL #2   Title Pt will be able to ambulate without a walker (cane) community distances with mod I    Time 8    Period Weeks    Status New    Target Date 08/15/20      PT LONG TERM GOAL #3   Title Pt will be able to increase UE and LE strength to 4+/5 to 5/5 for maximal functional mobility and strength    Time 8    Period Weeks    Status New    Target Date 08/15/20      PT LONG TERM GOAL #4   Title Pt will be able to perform 5 x STS from standard chair, no UE needed in 15 sec or less    Baseline elevated mat, 17 sec with UE to walker    Time 8    Period Weeks    Status New    Target Date 08/15/20      PT LONG TERM GOAL #5   Title Pt will be I with final HEP upon discharge from PT    Time 8    Period Weeks    Status New    Target Date 08/15/20                 Plan - 07/12/20 1446    Clinical Impression Statement Patient tolerated treatment session well with fatigue during interventions but no complaints of pain. He was able to perform sit<>stand from low mat with increased ease compared to previous 2 sessions. Pt with continued TTP along R piriformis with  some relief with manual techniques. He will continue to benefit from general strengthening to allow for improved tolerance with activities.    Personal Factors and Comorbidities Profession;Comorbidity 3+    Comorbidities anxiety, HTN, obesity    Examination-Activity Limitations Bathing;Dressing;Sit;Transfers;Sleep;Hygiene/Grooming;Bed Mobility;Bend;Lift;Squat;Stairs;Geophysical data processor for [employer];Reach Overhead;Stand;Toileting    Examination-Participation Restrictions Church;Interpersonal Relationship;Occupation;Cleaning;Laundry;Community Activity;Driving;Meal Prep;Shop;Yard Work    Stability/Clinical Decision Making Stable/Uncomplicated    Rehab Potential Excellent    PT Frequency 3x / week   2-3 times depending on transport   PT Duration 8 weeks    PT Treatment/Interventions ADLs/Self Care Home Management;Cryotherapy;Gait training;Therapeutic exercise;Patient/family education;Taping;Manual techniques;Stair training;Balance training;Functional mobility training;Neuromuscular re-education;Passive range of motion;Therapeutic activities;DME Instruction    PT Next Visit Plan Review STG. HEP. Progress BLE strengthening; gait training with RW, RUE strengthening (grip strength), manual therapy for B upper traps if indicated, core and glute strengthening, hip FL stretching    PT Home Exercise Plan [id number]    Consulted and Agree with Plan of Care Patient           Patient will benefit from skilled therapeutic intervention  in order to improve the following deficits and impairments:  Decreased activity tolerance,Decreased knowledge of use of DME,Decreased safety awareness,Decreased strength,Impaired flexibility,Impaired UE functional use,Pain,Obesity,Improper body mechanics,Decreased range of motion,Decreased scar mobility,Decreased knowledge of precautions,Decreased balance,Decreased endurance,Decreased mobility,Decreased skin integrity,Difficulty walking,Impaired sensation  Visit  Diagnosis: Status post cervical spinal fusion  Status post lumbar laminectomy  Muscle weakness (generalized)  Difficulty in walking, not elsewhere classified     Problem List Patient Active Problem List   Diagnosis Date Noted  . 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

## 2020-07-16 ENCOUNTER — Other Ambulatory Visit: Payer: Self-pay

## 2020-07-16 ENCOUNTER — Ambulatory Visit: Payer: PRIVATE HEALTH INSURANCE

## 2020-07-16 DIAGNOSIS — Z981 Arthrodesis status: Secondary | ICD-10-CM

## 2020-07-16 DIAGNOSIS — Z9889 Other specified postprocedural states: Secondary | ICD-10-CM

## 2020-07-16 DIAGNOSIS — R262 Difficulty in walking, not elsewhere classified: Secondary | ICD-10-CM

## 2020-07-16 DIAGNOSIS — M6281 Muscle weakness (generalized): Secondary | ICD-10-CM

## 2020-07-16 NOTE — Therapy (Signed)
[place], [place], [address] Phone: [phone]   Fax:  [phone]  Physical Therapy Treatment  Patient Details  Name: Randy Stewart MRN: [id number] Date of Birth: 04/24/1971 Referring Provider (PT): Dr. Consuella Lose   Encounter Date: 07/16/2020   PT End of Session - 07/16/20 1619    Visit Number 5    Number of Visits 16    Date for PT Re-Evaluation 08/15/20    Authorization Type Generic Commercial    PT Start Time 1615    PT Stop Time 1657    PT Time Calculation (min) 42 min    Activity Tolerance Patient tolerated treatment well    Behavior During Therapy [facility] for tasks assessed/performed           Past Medical History:  Diagnosis Date  . Anxiety   . Hypertension     Past Surgical History:  Procedure Laterality Date  . ANTERIOR CERVICAL DECOMP/DISCECTOMY FUSION N/A 06/08/2020   Procedure: Anterior Cervical Discectomy Fusion Cervical Four-Five, Cervical Five-Six;  Surgeon: Consuella Lose, MD;  Location: [place];  Service: Neurosurgery;  Laterality: N/A;  . LUMBAR LAMINECTOMY/DECOMPRESSION MICRODISCECTOMY N/A 06/06/2020   Procedure: LUMBAR LAMINECTOMY THORACIC ELEVEN-TWELVE;  Surgeon: Consuella Lose, MD;  Location: [place];  Service: Neurosurgery;  Laterality: N/A;  . NO PAST SURGERIES    . ORIF ANKLE FRACTURE Left 11/30/2018   Procedure: OPEN REDUCTION INTERNAL FIXATION (ORIF) left ankle lateral malleolar fracture;  Surgeon: Wylene Simmer, MD;  Location: [place];  Service: Orthopedics;  Laterality: Left;  37min    There were no vitals filed for this visit.   Subjective Assessment - 07/16/20 1842    Subjective "I just have that stiffness in my legs. My back feels fine."    Patient is accompained by: Family member   wife   Pertinent History L ankle ORIF 2019.  T11-T12 laminectomy .  Cervical fusion C4-C6    Limitations Sitting;Walking;Lifting;House hold  activities;Standing    How long can you sit comfortably? no pain    How long can you stand comfortably? has not pushed this beyond household    How long can you walk comfortably? limited by L knee    Diagnostic tests Pre and post operative    Patient Stated Goals Get stronger and  get back to work.    Currently in Pain? No/denies    Pain Score 0-No pain   reports stiffness in thighs but does not provide a numerical value   Pain Onset 1 to 4 weeks ago    Pain Onset 1 to 4 weeks ago              [facility] PT Assessment - 07/16/20 0001      Assessment   Medical Diagnosis Lumbar laminectomy, cervical fusion     Referring Provider (PT) Dr. Consuella Lose    Onset Date/Surgical Date 06/08/20                         [facility] Adult PT Treatment/Exercise - 07/16/20 0001      Ambulation/Gait   Ambulation/Gait Yes    Ambulation/Gait Assistance 6: Modified independent (Device/Increase time)    Ambulation Distance (Feet) 150 Feet    Assistive device Rolling walker    Gait Pattern Step-through pattern    Ambulation Surface Level;Indoor      Self-Care   Self-Care Other Self-Care Comments    Other Self-Care Comments  Reviewed HEP and  spinal precautions (cervical and lumbar). Provided pt with TPDN handout for information due to pt potentially being interested in [facility] for R piriformis.      Lumbar Exercises: Aerobic   Nustep L6 x 5 min LE only      Lumbar Exercises: Supine   Other Supine Lumbar Exercises Sciatic nerve glides RLE x 20      Knee/Hip Exercises: Standing   Heel Raises Both;15 reps    Hip Abduction AROM;Stengthening;Both;15 reps;Knee straight    Hip Extension AROM;Stengthening;Both;15 reps;Knee straight    Other Standing Knee Exercises Alternating marches with BUE at freemotion x 30 (15x each LE)      Knee/Hip Exercises: Supine   Quad Sets Strengthening;Both;20 reps    Quad Sets Limitations towel roll under knee    Straight Leg Raises  AROM;Strengthening;Both;2 sets;10 reps    Straight Leg Raises Limitations quad lag especially RLE      Manual Therapy   Manual Therapy Soft tissue mobilization    Soft tissue mobilization STM and myofascial release along R piriformis. Roller along B quads                  PT Education - 07/16/20 1843    Education Details Reviewed HEP and spinal precautions (cervical and lumbar). Provided pt with TPDN handout for information due to pt potentially being interested in [facility] for R piriformis.    Person(s) Educated Patient    Methods Explanation;Demonstration    Comprehension Verbalized understanding;Returned demonstration            PT Short Term Goals - 07/16/20 1648      PT SHORT TERM GOAL #1   Title Pt will be able to show independence with HEP for LE and core strengthening    Baseline given on eval    Time 4    Period Weeks    Status Achieved    Target Date 07/18/20      PT SHORT TERM GOAL #2   Title Pt will understand FOTO and potential to improve his condition within 3 visits    Time 2    Period Weeks    Status Achieved    Target Date 07/18/20      PT SHORT TERM GOAL #3   Title Pt will consistently wear back brace and follow precuations as directed for safety with community mobility    Baseline Pt D/C from back brace per MD    Time 4    Period Weeks    Status Achieved    Target Date 07/18/20      PT SHORT TERM GOAL #4   Title Pt will be able to walk without increased knee pain and improved stride length , LRAD    Baseline No pain but experiences knee buckling; still demonstrates decreased stride length    Time 4    Period Weeks    Status Partially Met    Target Date 07/18/20             PT Long Term Goals - 06/21/20 1433      PT LONG TERM GOAL #1   Title Pt will improve FOTO to 58% ability or greater for improved functional mobility    Time 8    Period Weeks    Status New    Target Date 08/15/20      PT LONG TERM GOAL #2   Title Pt will be  able to ambulate without a walker (cane) community distances with mod I  Time 8    Period Weeks    Status New    Target Date 08/15/20      PT LONG TERM GOAL #3   Title Pt will be able to increase UE and LE strength to 4+/5 to 5/5 for maximal functional mobility and strength    Time 8    Period Weeks    Status New    Target Date 08/15/20      PT LONG TERM GOAL #4   Title Pt will be able to perform 5 x STS from standard chair, no UE needed in 15 sec or less    Baseline elevated mat, 17 sec with UE to walker    Time 8    Period Weeks    Status New    Target Date 08/15/20      PT LONG TERM GOAL #5   Title Pt will be I with final HEP upon discharge from PT    Time 8    Period Weeks    Status New    Target Date 08/15/20                 Plan - 07/16/20 1844    Clinical Impression Statement Patient was able to perform standing exercises today with fatigue but no complaints of increased pain. He expresses TTP along R piriformis and shows potential interest in TPDN. He is eager to progress from RW to cane eventually as he strengthens and improves stability.    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[employer];Reach Overhead;Stand;[place];Interpersonal Relationship;Occupation;Cleaning;Laundry;Community Activity;Driving;Meal Prep;Shop;Yard Work    Stability/Clinical Decision Making Stable/Uncomplicated    Rehab Potential Excellent    PT Frequency 3x / week   2-3 times depending on transport   PT Duration 8 weeks    PT Treatment/Interventions ADLs/Self Care Home Management;Cryotherapy;Gait training;Therapeutic exercise;Patient/family education;Taping;Manual techniques;Stair training;Balance training;Functional  mobility training;Neuromuscular re-education;Passive range of motion;Therapeutic activities;DME Instruction    PT Next Visit Plan HEP. Progress BLE strengthening; gait training with RW and consider gait training with cane, RUE strengthening (grip strength), manual therapy for B upper traps if indicated, core and glute strengthening, hip FL stretching    PT Home Exercise Plan [id number]    Consulted and Agree with Plan of Care Patient           Patient will benefit from skilled therapeutic intervention in order to improve the following deficits and impairments:  Decreased activity tolerance,Decreased knowledge of use of DME,Decreased safety awareness,Decreased strength,Impaired flexibility,Impaired UE functional use,Pain,Obesity,Improper body mechanics,Decreased range of motion,Decreased scar mobility,Decreased knowledge of precautions,Decreased balance,Decreased endurance,Decreased mobility,Decreased skin integrity,Difficulty walking,Impaired sensation  Visit Diagnosis: Status post cervical spinal fusion  Status post lumbar laminectomy  Muscle weakness (generalized)  Difficulty in walking, not elsewhere classified     Problem List Patient Active Problem List   Diagnosis Date Noted  . Thoracic myelopathy 06/05/2020    Haydee Monica, PT, DPT 07/16/20 6:57 PM  [facility] [address] [place], [place], [address] Phone: [phone]   Fax:  [phone]  Name: Randy Stewart MRN: [id number] Date of Birth: 1970/11/16

## 2020-07-18 ENCOUNTER — Ambulatory Visit: Payer: PRIVATE HEALTH INSURANCE

## 2020-07-18 ENCOUNTER — Other Ambulatory Visit: Payer: Self-pay

## 2020-07-18 DIAGNOSIS — R262 Difficulty in walking, not elsewhere classified: Secondary | ICD-10-CM

## 2020-07-18 DIAGNOSIS — Z981 Arthrodesis status: Secondary | ICD-10-CM | POA: Diagnosis not present

## 2020-07-18 DIAGNOSIS — M6281 Muscle weakness (generalized): Secondary | ICD-10-CM

## 2020-07-18 DIAGNOSIS — Z9889 Other specified postprocedural states: Secondary | ICD-10-CM

## 2020-07-18 NOTE — Therapy (Signed)
[place], [place], [address] Phone: [phone]   Fax:  [phone]  Physical Therapy Treatment  Patient Details  Name: Randy Stewart MRN: [id number] Date of Birth: Jan 06, 1971 Referring Provider (PT): Dr. Consuella Lose   Encounter Date: 07/18/2020   PT End of Session - 07/18/20 1713    Visit Number 6    Number of Visits 16    Date for PT Re-Evaluation 08/15/20    Authorization Type Generic Commercial    PT Start Time 1702    PT Stop Time 1745    PT Time Calculation (min) 43 min    Activity Tolerance Patient tolerated treatment well    Behavior During Therapy [facility] for tasks assessed/performed           Past Medical History:  Diagnosis Date  . Anxiety   . Hypertension     Past Surgical History:  Procedure Laterality Date  . ANTERIOR CERVICAL DECOMP/DISCECTOMY FUSION N/A 06/08/2020   Procedure: Anterior Cervical Discectomy Fusion Cervical Four-Five, Cervical Five-Six;  Surgeon: Consuella Lose, MD;  Location: [place];  Service: Neurosurgery;  Laterality: N/A;  . LUMBAR LAMINECTOMY/DECOMPRESSION MICRODISCECTOMY N/A 06/06/2020   Procedure: LUMBAR LAMINECTOMY THORACIC ELEVEN-TWELVE;  Surgeon: Consuella Lose, MD;  Location: [place];  Service: Neurosurgery;  Laterality: N/A;  . NO PAST SURGERIES    . ORIF ANKLE FRACTURE Left 11/30/2018   Procedure: OPEN REDUCTION INTERNAL FIXATION (ORIF) left ankle lateral malleolar fracture;  Surgeon: Wylene Simmer, MD;  Location: [place];  Service: Orthopedics;  Laterality: Left;  54min    There were no vitals filed for this visit.   Subjective Assessment - 07/18/20 1706    Subjective "Feeling pretty good. About a 1 or 2 pain level in my thighs. I did some stretches before I came in today."    Patient is accompained by: Family member   wife   Pertinent History L ankle ORIF 2019.  T11-T12 laminectomy .  Cervical fusion C4-C6    Limitations  Sitting;Walking;Lifting;House hold activities;Standing    How long can you sit comfortably? no pain    How long can you stand comfortably? has not pushed this beyond household    How long can you walk comfortably? limited by L knee    Diagnostic tests Pre and post operative    Patient Stated Goals Get stronger and  get back to work.    Currently in Pain? Yes    Pain Score 2     Pain Location Leg    Pain Orientation Right;Left;Upper    Pain Descriptors / Indicators Tightness    Pain Onset 1 to 4 weeks ago    Pain Onset 1 to 4 weeks ago              [facility] PT Assessment - 07/18/20 0001      Assessment   Medical Diagnosis Lumbar laminectomy, cervical fusion     Referring Provider (PT) Dr. Consuella Lose    Onset Date/Surgical Date 06/08/20      Observation/Other Assessments   Focus on Therapeutic Outcomes (FOTO)  43% function; 58% predicted      ROM / Strength   AROM / PROM / Strength Strength      Strength   Overall Strength Comments Handheld dynamometer for grip strength 3 attempts each hand: R hand 20, 20, 18. L hand: 32, 36, 32      6 minute walk test results    Aerobic Endurance Distance Walked 196  Endurance additional comments Using rolling walker - with seated rest break after 5 min 22 seconds                         [facility] Adult PT Treatment/Exercise - 07/18/20 0001      Self-Care   Self-Care Other Self-Care Comments    Other Self-Care Comments  Reviewed and updated HEP. Reviewed FOTO and progress.      Lumbar Exercises: Aerobic   Nustep L5 x 6 min LE only      Knee/Hip Exercises: Standing   Hip Abduction AROM;Stengthening;Both;2 sets;15 reps;Knee straight    Hip Extension AROM;Stengthening;Both;2 sets;15 reps;Knee straight    Other Standing Knee Exercises Toe raises (alternating due to patient compensating when performing with BLE simultaneously) 2 x 15 each      Knee/Hip Exercises: Seated   Long Arc Quad AROM;Strengthening;Right;2  sets;15 reps;Weights    Long Arc Quad Weight 3 lbs.      Hand Exercises   Other Hand Exercises Squeezing putty with R hand x 2 min    Other Hand Exercises Resisted digit extension x 20                  PT Education - 07/18/20 1707    Education Details Reviewed HEP and potential trial of treadmill next session per pt request. Reviewed FOTO and progress.    Person(s) Educated Patient    Methods Explanation;Demonstration    Comprehension Verbalized understanding;Returned demonstration            PT Short Term Goals - 07/16/20 1648      PT SHORT TERM GOAL #1   Title Pt will be able to show independence with HEP for LE and core strengthening    Baseline given on eval    Time 4    Period Weeks    Status Achieved    Target Date 07/18/20      PT SHORT TERM GOAL #2   Title Pt will understand FOTO and potential to improve his condition within 3 visits    Time 2    Period Weeks    Status Achieved    Target Date 07/18/20      PT SHORT TERM GOAL #3   Title Pt will consistently wear back brace and follow precuations as directed for safety with community mobility    Baseline Pt D/C from back brace per MD    Time 4    Period Weeks    Status Achieved    Target Date 07/18/20      PT SHORT TERM GOAL #4   Title Pt will be able to walk without increased knee pain and improved stride length , LRAD    Baseline No pain but experiences knee buckling; still demonstrates decreased stride length    Time 4    Period Weeks    Status Partially Met    Target Date 07/18/20             PT Long Term Goals - 06/21/20 1433      PT LONG TERM GOAL #1   Title Pt will improve FOTO to 58% ability or greater for improved functional mobility    Time 8    Period Weeks    Status New    Target Date 08/15/20      PT LONG TERM GOAL #2   Title Pt will be able to ambulate without a walker (cane) community distances with mod I  Time 8    Period Weeks    Status New    Target Date 08/15/20       PT LONG TERM GOAL #3   Title Pt will be able to increase UE and LE strength to 4+/5 to 5/5 for maximal functional mobility and strength    Time 8    Period Weeks    Status New    Target Date 08/15/20      PT LONG TERM GOAL #4   Title Pt will be able to perform 5 x STS from standard chair, no UE needed in 15 sec or less    Baseline elevated mat, 17 sec with UE to walker    Time 8    Period Weeks    Status New    Target Date 08/15/20      PT LONG TERM GOAL #5   Title Pt will be I with final HEP upon discharge from PT    Time 8    Period Weeks    Status New    Target Date 08/15/20                 Plan - 07/18/20 1713    Clinical Impression Statement Patient tolerated treatment session well with no adverse effects or complaints of increased pain. He experienced fatigue during 6MWT and required seated rest break after 5 min and 22 sec (196 feet). Pt demonstrated R foot drop and decreased eccentric tibialis anterior control towards end of 6MWT secondary to fatigue. Initiated toe raises and added resisted dorsiflexion to HEP (against red theraband). He was able to perform all sets of standing exercises without requiring seated break. Pt requested to potentially try walking on treadmill next session at slow speed. He will benefit from continued skilled PT to address strength and endurance deficits to allow for improved tolerance with functional activities and to progress towards gait with LRAD/no AD.    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[employer];Reach Overhead;Stand;[place];Interpersonal Relationship;Occupation;Cleaning;Laundry;Community Activity;Driving;Meal Prep;Shop;Yard Work    Stability/Clinical Decision  Making Stable/Uncomplicated    Rehab Potential Excellent    PT Frequency 3x / week   2-3 times depending on transport   PT Duration 8 weeks    PT Treatment/Interventions ADLs/Self Care Home Management;Cryotherapy;Gait training;Therapeutic exercise;Patient/family education;Taping;Manual techniques;Stair training;Balance training;Functional mobility training;Neuromuscular re-education;Passive range of motion;Therapeutic activities;DME Instruction    PT Next Visit Plan HEP. Progress BLE strengthening; continue gait training with RW and consider gait training with cane, RUE strengthening (grip strength), manual therapy for B upper traps if indicated, core and glute strengthening, hip FL stretching. Consider treadmill.    PT Home Exercise Plan [id number] - scapular retraction, TrA bracing, SLR, quad set, LAQ, bridge, grip squeeze with putty, heel raises, toe raises, resisted R ankle DF (red band)    Consulted and Agree with Plan of Care Patient           Patient will benefit from skilled therapeutic intervention in order to improve the following deficits and impairments:  Decreased activity tolerance,Decreased knowledge of use of DME,Decreased safety awareness,Decreased strength,Impaired flexibility,Impaired UE functional use,Pain,Obesity,Improper body mechanics,Decreased range of motion,Decreased scar mobility,Decreased knowledge of precautions,Decreased balance,Decreased endurance,Decreased mobility,Decreased skin integrity,Difficulty walking,Impaired sensation  Visit Diagnosis: Status post cervical spinal fusion  Status post lumbar laminectomy  Muscle weakness (generalized)  Difficulty in walking, not elsewhere classified     Problem List Patient Active  Problem List   Diagnosis Date Noted  . Thoracic myelopathy 06/05/2020     Haydee Monica, PT, DPT 07/18/20 8:18 PM  [facility] [address] [place], [place], [address] Phone:  [phone]   Fax:  [phone]  Name: Randy Stewart MRN: [id number] Date of Birth: 08/05/1970

## 2020-07-20 ENCOUNTER — Encounter: Payer: Self-pay | Admitting: Physical Therapy

## 2020-07-20 ENCOUNTER — Other Ambulatory Visit: Payer: Self-pay

## 2020-07-20 ENCOUNTER — Ambulatory Visit: Payer: PRIVATE HEALTH INSURANCE | Admitting: Physical Therapy

## 2020-07-20 DIAGNOSIS — R262 Difficulty in walking, not elsewhere classified: Secondary | ICD-10-CM

## 2020-07-20 DIAGNOSIS — Z981 Arthrodesis status: Secondary | ICD-10-CM | POA: Diagnosis not present

## 2020-07-20 DIAGNOSIS — Z9889 Other specified postprocedural states: Secondary | ICD-10-CM

## 2020-07-20 DIAGNOSIS — M6281 Muscle weakness (generalized): Secondary | ICD-10-CM

## 2020-07-20 NOTE — Therapy (Signed)
[place], [place], [address] Phone: [phone]   Fax:  [phone]  Physical Therapy Treatment  Patient Details  Name: Randy Stewart MRN: [id number] Date of Birth: Nov 14, 1970 Referring Provider (PT): Dr. Consuella Lose   Encounter Date: 07/20/2020   PT End of Session - 07/20/20 1307    Visit Number 7    Number of Visits 16    Date for PT Re-Evaluation 08/15/20    Authorization Type Generic Commercial    PT Start Time 1215    PT Stop Time 1305    PT Time Calculation (min) 50 min    Activity Tolerance Patient tolerated treatment well    Behavior During Therapy [facility] for tasks assessed/performed           Past Medical History:  Diagnosis Date  . Anxiety   . Hypertension     Past Surgical History:  Procedure Laterality Date  . ANTERIOR CERVICAL DECOMP/DISCECTOMY FUSION N/A 06/08/2020   Procedure: Anterior Cervical Discectomy Fusion Cervical Four-Five, Cervical Five-Six;  Surgeon: Consuella Lose, MD;  Location: [place];  Service: Neurosurgery;  Laterality: N/A;  . LUMBAR LAMINECTOMY/DECOMPRESSION MICRODISCECTOMY N/A 06/06/2020   Procedure: LUMBAR LAMINECTOMY THORACIC ELEVEN-TWELVE;  Surgeon: Consuella Lose, MD;  Location: [place];  Service: Neurosurgery;  Laterality: N/A;  . NO PAST SURGERIES    . ORIF ANKLE FRACTURE Left 11/30/2018   Procedure: OPEN REDUCTION INTERNAL FIXATION (ORIF) left ankle lateral malleolar fracture;  Surgeon: Wylene Simmer, MD;  Location: [place];  Service: Orthopedics;  Laterality: Left;  10min    There were no vitals filed for this visit.   Subjective Assessment - 07/20/20 1220    Subjective Making small improvement, feels stronger overall.  I want to get rid of this walker but I know im not ready.  MIn pain in Rt hip (1/10) Upper body- waking up less and less with pain in shoulder.    Currently in Pain? Yes    Pain Score 1                 [facility] Adult PT Treatment/Exercise - 07/20/20 0001      Pilates   Pilates Tower single leg arcs, knee ext and circles   yellow spring   Other Pilates poor control , needed min A for contol and maintaining neutral hip (tends to ER and abd)      Lumbar Exercises: Stretches   Hip Flexor Stretch 2 reps;60 seconds      Lumbar Exercises: Aerobic   Nustep L5 x 6 min LE and UE      Lumbar Exercises: Standing   Heel Raises 20 reps    Heel Raises Limitations shoes off    Functional Squats 10 reps      Knee/Hip Exercises: Standing   Hip Flexion Limitations high march x 15    Hip Abduction AROM;Stengthening;Both;2 sets;15 reps;Knee straight    Abduction Limitations knee ext    Hip Extension AROM;Stengthening;Both;2 sets;15 reps;Knee straight    Extension Limitations 2nd set with bent knee    Functional Squat 15 reps    Functional Squat Limitations then sit to stand x 15      Manual Therapy   Manual therapy comments Rt hip ER, IR PROM                  PT Education - 07/20/20 1306    Education Details hip flexor and L spine. LE control with Pilates Tower  Person(s) Educated Patient    Methods Explanation;Demonstration    Comprehension Returned demonstration;Verbalized understanding            PT Short Term Goals - 07/16/20 1648      PT SHORT TERM GOAL #1   Title Pt will be able to show independence with HEP for LE and core strengthening    Baseline given on eval    Time 4    Period Weeks    Status Achieved    Target Date 07/18/20      PT SHORT TERM GOAL #2   Title Pt will understand FOTO and potential to improve his condition within 3 visits    Time 2    Period Weeks    Status Achieved    Target Date 07/18/20      PT SHORT TERM GOAL #3   Title Pt will consistently wear back brace and follow precuations as directed for safety with community mobility    Baseline Pt D/C from back brace per MD    Time 4    Period Weeks    Status Achieved    Target Date 07/18/20       PT SHORT TERM GOAL #4   Title Pt will be able to walk without increased knee pain and improved stride length , LRAD    Baseline No pain but experiences knee buckling; still demonstrates decreased stride length    Time 4    Period Weeks    Status Partially Met    Target Date 07/18/20             PT Long Term Goals - 06/21/20 1433      PT LONG TERM GOAL #1   Title Pt will improve FOTO to 58% ability or greater for improved functional mobility    Time 8    Period Weeks    Status New    Target Date 08/15/20      PT LONG TERM GOAL #2   Title Pt will be able to ambulate without a walker (cane) community distances with mod I    Time 8    Period Weeks    Status New    Target Date 08/15/20      PT LONG TERM GOAL #3   Title Pt will be able to increase UE and LE strength to 4+/5 to 5/5 for maximal functional mobility and strength    Time 8    Period Weeks    Status New    Target Date 08/15/20      PT LONG TERM GOAL #4   Title Pt will be able to perform 5 x STS from standard chair, no UE needed in 15 sec or less    Baseline elevated mat, 17 sec with UE to walker    Time 8    Period Weeks    Status New    Target Date 08/15/20      PT LONG TERM GOAL #5   Title Pt will be I with final HEP upon discharge from PT    Time 8    Period Weeks    Status New    Target Date 08/15/20                 Plan - 07/20/20 1307    Clinical Impression Statement Patient works hard during session.  With small range squats, Rt knee buckles, adducts and poses a safety risk.  Used Pilates Tower to address hip control and stretching. Rt anterior  hip stretching effective for opening L spine and hip. Cont POC . TM not safe today.    PT Treatment/Interventions ADLs/Self Care Home Management;Cryotherapy;Gait training;Therapeutic exercise;Patient/family education;Taping;Manual techniques;Stair training;Balance training;Functional mobility training;Neuromuscular re-education;Passive range of  motion;Therapeutic activities;DME Instruction    PT Next Visit Plan Try Reformer. Add hip flexor off table to HEP.  Progress BLE strengthening; continue gait training with RW and consider gait training with cane, RUE strengthening (grip strength), manual therapy for B upper traps if indicated, core and glute strengthening, hip FL stretching. Consider treadmill.    PT Home Exercise Plan [id number] - scapular retraction, TrA bracing, SLR, quad set, LAQ, bridge, grip squeeze with putty, heel raises, toe raises, resisted R ankle DF (red band)    Consulted and Agree with Plan of Care Patient           Patient will benefit from skilled therapeutic intervention in order to improve the following deficits and impairments:  Decreased activity tolerance,Decreased knowledge of use of DME,Decreased safety awareness,Decreased strength,Impaired flexibility,Impaired UE functional use,Pain,Obesity,Improper body mechanics,Decreased range of motion,Decreased scar mobility,Decreased knowledge of precautions,Decreased balance,Decreased endurance,Decreased mobility,Decreased skin integrity,Difficulty walking,Impaired sensation  Visit Diagnosis: Status post cervical spinal fusion  Status post lumbar laminectomy  Muscle weakness (generalized)  Difficulty in walking, not elsewhere classified     Problem List Patient Active Problem List   Diagnosis Date Noted  . Thoracic myelopathy 06/05/2020    Tequilla Cousineau 07/20/2020, 1:12 PM  [facility] [address] [place], [place], [address] Phone: [phone]   Fax:  [phone]  Name: YOUSIF EDELSON MRN: [id number] Date of Birth: 1971/07/01  Raeford Razor, PT 07/20/20 1:12 PM Phone: [phone] Fax: [phone]

## 2020-07-23 ENCOUNTER — Ambulatory Visit: Payer: PRIVATE HEALTH INSURANCE | Admitting: Physical Therapy

## 2020-07-23 ENCOUNTER — Other Ambulatory Visit: Payer: Self-pay

## 2020-07-23 DIAGNOSIS — R262 Difficulty in walking, not elsewhere classified: Secondary | ICD-10-CM

## 2020-07-23 DIAGNOSIS — Z981 Arthrodesis status: Secondary | ICD-10-CM

## 2020-07-23 DIAGNOSIS — Z9889 Other specified postprocedural states: Secondary | ICD-10-CM

## 2020-07-23 DIAGNOSIS — M6281 Muscle weakness (generalized): Secondary | ICD-10-CM

## 2020-07-23 NOTE — Therapy (Signed)
[place], [place], [address] Phone: [phone]   Fax:  [phone]  Physical Therapy Treatment  Patient Details  Name: Randy Stewart MRN: [id number] Date of Birth: 05-04-1971 Referring Provider (PT): Dr. Consuella Lose   Encounter Date: 07/23/2020   PT End of Session - 07/23/20 1512    Visit Number 8    Number of Visits 16    Date for PT Re-Evaluation 08/15/20    Authorization Type Generic Commercial    PT Start Time 1453    PT Stop Time 1531    PT Time Calculation (min) 38 min    Activity Tolerance Patient tolerated treatment well    Behavior During Therapy [facility] for tasks assessed/performed           Past Medical History:  Diagnosis Date  . Anxiety   . Hypertension     Past Surgical History:  Procedure Laterality Date  . ANTERIOR CERVICAL DECOMP/DISCECTOMY FUSION N/A 06/08/2020   Procedure: Anterior Cervical Discectomy Fusion Cervical Four-Five, Cervical Five-Six;  Surgeon: Consuella Lose, MD;  Location: [place];  Service: Neurosurgery;  Laterality: N/A;  . LUMBAR LAMINECTOMY/DECOMPRESSION MICRODISCECTOMY N/A 06/06/2020   Procedure: LUMBAR LAMINECTOMY THORACIC ELEVEN-TWELVE;  Surgeon: Consuella Lose, MD;  Location: [place];  Service: Neurosurgery;  Laterality: N/A;  . NO PAST SURGERIES    . ORIF ANKLE FRACTURE Left 11/30/2018   Procedure: OPEN REDUCTION INTERNAL FIXATION (ORIF) left ankle lateral malleolar fracture;  Surgeon: Wylene Simmer, MD;  Location: [place];  Service: Orthopedics;  Laterality: Left;  62mn    There were no vitals filed for this visit.   Subjective Assessment - 07/23/20 1507    Subjective Doing pretty good today, back is about 2/10.  A little stifffness in neck when I woke up.    Currently in Pain? Yes    Pain Score 2     Pain Location Leg    Pain Orientation Right;Posterior;Lateral    Pain Descriptors / Indicators Aching;Tightness    Pain  Type Surgical pain    Pain Onset More than a month ago    Pain Frequency Intermittent               [facility] Adult PT Treatment/Exercise - 07/23/20 0001      Pilates   Pilates Reformer see note             Pilates Reformer used for LE/core strength, postural strength, lumbopelvic disassociation and core control.  Exercises included:  Footwork 2 Red 1 Blue 1 Yellow   Double:  Parallel and Hip External rotation (natural position)   Toes and heels  Used ball to hold legs in more parallel   Single : unable to maintain opposite leg in table top (90 deg) unassisted   Pressing out 2 red 1 blue x 15 each LE   Bridging with blue band x 10  Feet on platform (pushes away vs Lift)   Feet in Straps 1 Red 1 yellow  Small ROM arcs with PT performing 50% of task  X 10   Squats x 10 with PT assist      PT Education - 07/23/20 1631    Education Details Pilates Reformer    Person(s) Educated Patient    Methods Explanation;Tactile cues;Verbal cues    Comprehension Verbalized understanding;Need further instruction            PT Short Term Goals - 07/23/20 1632      PT SHORT TERM  GOAL #1   Title Pt will be able to show independence with HEP for LE and core strengthening    Status Achieved      PT SHORT TERM GOAL #2   Title Pt will understand FOTO and potential to improve his condition within 3 visits    Status Achieved      PT SHORT TERM GOAL #3   Title Pt will consistently wear back brace and follow precuations as directed for safety with community mobility    Status Achieved      PT SHORT TERM GOAL #4   Title Pt will be able to walk without increased knee pain and improved stride length , LRAD    Baseline No pain but experiences knee buckling; still demonstrates decreased stride length    Status Partially Met             PT Long Term Goals - 06/21/20 1433      PT LONG TERM GOAL #1   Title Pt will improve FOTO to 58% ability or greater for improved functional  mobility    Time 8    Period Weeks    Status New    Target Date 08/15/20      PT LONG TERM GOAL #2   Title Pt will be able to ambulate without a walker (cane) community distances with mod I    Time 8    Period Weeks    Status New    Target Date 08/15/20      PT LONG TERM GOAL #3   Title Pt will be able to increase UE and LE strength to 4+/5 to 5/5 for maximal functional mobility and strength    Time 8    Period Weeks    Status New    Target Date 08/15/20      PT LONG TERM GOAL #4   Title Pt will be able to perform 5 x STS from standard chair, no UE needed in 15 sec or less    Baseline elevated mat, 17 sec with UE to walker    Time 8    Period Weeks    Status New    Target Date 08/15/20      PT LONG TERM GOAL #5   Title Pt will be I with final HEP upon discharge from PT    Time 8    Period Weeks    Status New    Target Date 08/15/20                 Plan - 07/23/20 1632    Clinical Impression Statement Utilized Pilates Reformer for work on hip and knee strength and control.  Able to accomodate for limitations with modifications to Reformer (lower footbar) and manual A for foot placement.  Able to control Rt knee hyperextension well.  Likely a nerve irritated when in standing.  Pt better able to maintain hip adduction and can plantaflex with gravity eliminated.  Cont POC.    Examination-Participation Restrictions Church;Interpersonal Relationship;Occupation;Cleaning;Laundry;Community Activity;Driving;Meal Prep;Shop;Yard Work    PT Treatment/Interventions ADLs/Self Care Home Management;Cryotherapy;Gait training;Therapeutic exercise;Patient/family education;Taping;Manual techniques;Stair training;Balance training;Functional mobility training;Neuromuscular re-education;Passive range of motion;Therapeutic activities;DME Instruction    PT Next Visit Plan Add hip flexor off table to HEP.  Progress BLE strengthening; continue gait training with RW and consider gait training with  cane, RUE strengthening (grip strength), manual therapy for B upper traps if indicated, core and glute strengthening, hip FL stretching. Consider treadmill, include Reformer    PT Home   Exercise Plan [id number] - scapular retraction, TrA bracing, SLR, quad set, LAQ, bridge, grip squeeze with putty, heel raises, toe raises, resisted R ankle DF (red band)    Consulted and Agree with Plan of Care Patient           Patient will benefit from skilled therapeutic intervention in order to improve the following deficits and impairments:  Decreased activity tolerance,Decreased knowledge of use of DME,Decreased safety awareness,Decreased strength,Impaired flexibility,Impaired UE functional use,Pain,Obesity,Improper body mechanics,Decreased range of motion,Decreased scar mobility,Decreased knowledge of precautions,Decreased balance,Decreased endurance,Decreased mobility,Decreased skin integrity,Difficulty walking,Impaired sensation  Visit Diagnosis: Status post cervical spinal fusion  Status post lumbar laminectomy  Muscle weakness (generalized)  Difficulty in walking, not elsewhere classified     Problem List Patient Active Problem List   Diagnosis Date Noted  . Thoracic myelopathy 06/05/2020    Randy Stewart 07/23/2020, 4:45 PM  [facility] [address] [place], [place], [address] Phone: [phone]   Fax:  [phone]  Name: Randy Stewart MRN: [id number] Date of Birth: Jun 13, 1971   Raeford Razor, PT 07/23/20 4:45 PM Phone: [phone] Fax: [phone]

## 2020-07-25 ENCOUNTER — Other Ambulatory Visit: Payer: Self-pay

## 2020-07-25 ENCOUNTER — Ambulatory Visit: Payer: PRIVATE HEALTH INSURANCE | Admitting: Physical Therapy

## 2020-07-25 ENCOUNTER — Encounter: Payer: Self-pay | Admitting: Physical Therapy

## 2020-07-25 DIAGNOSIS — M6281 Muscle weakness (generalized): Secondary | ICD-10-CM

## 2020-07-25 DIAGNOSIS — R262 Difficulty in walking, not elsewhere classified: Secondary | ICD-10-CM

## 2020-07-25 DIAGNOSIS — Z9889 Other specified postprocedural states: Secondary | ICD-10-CM

## 2020-07-25 DIAGNOSIS — Z981 Arthrodesis status: Secondary | ICD-10-CM | POA: Diagnosis not present

## 2020-07-25 NOTE — Therapy (Signed)
[place], [place], [address] Phone: [phone]   Fax:  [phone]  Physical Therapy Treatment  Patient Details  Name: Randy Stewart MRN: [id number] Date of Birth: Apr 02, 1971 Referring Provider (PT): Dr. Consuella Lose   Encounter Date: 07/25/2020   PT End of Session - 07/25/20 1516    Visit Number 9    Number of Visits 16    Date for PT Re-Evaluation 08/15/20    Authorization Type Generic Commercial    PT Start Time 1500    PT Stop Time 1540    PT Time Calculation (min) 40 min           Past Medical History:  Diagnosis Date  . Anxiety   . Hypertension     Past Surgical History:  Procedure Laterality Date  . ANTERIOR CERVICAL DECOMP/DISCECTOMY FUSION N/A 06/08/2020   Procedure: Anterior Cervical Discectomy Fusion Cervical Four-Five, Cervical Five-Six;  Surgeon: Consuella Lose, MD;  Location: [place];  Service: Neurosurgery;  Laterality: N/A;  . LUMBAR LAMINECTOMY/DECOMPRESSION MICRODISCECTOMY N/A 06/06/2020   Procedure: LUMBAR LAMINECTOMY THORACIC ELEVEN-TWELVE;  Surgeon: Consuella Lose, MD;  Location: [place];  Service: Neurosurgery;  Laterality: N/A;  . NO PAST SURGERIES    . ORIF ANKLE FRACTURE Left 11/30/2018   Procedure: OPEN REDUCTION INTERNAL FIXATION (ORIF) left ankle lateral malleolar fracture;  Surgeon: Wylene Simmer, MD;  Location: [place];  Service: Orthopedics;  Laterality: Left;  11mn    There were no vitals filed for this visit.   Subjective Assessment - 07/25/20 1503    Subjective Patient arrives late.  Was sore from last visit.          No pain today.    [facility] Treatment/Exercise - 07/25/20 0001      Lumbar Exercises: Stretches   Active Hamstring Stretch 3 reps;20 seconds      Lumbar Exercises: Seated   Sit to Stand 15 reps    Sit to Stand Limitations progressed to mini squat      Lumbar Exercises: Supine   Heel Slides Limitations  with ball x 15    Bridge Limitations 2 sets x 10 with physio ball PT assisting    Straight Leg Raise 10 reps    Straight Leg Raises Limitations 2 sets      Lumbar Exercises: Sidelying   Clam Both;20 reps    Hip Abduction Both;10 reps    Hip Abduction Weights (lbs) 2 sets         NuStep level 6 for 6 min independently.          PT Education - 07/25/20 1511    Education Details sit to stand, knee stability    Person(s) Educated Patient    Methods Explanation;Demonstration    Comprehension Verbalized understanding;Returned demonstration            PT Short Term Goals - 07/23/20 1632      PT SHORT TERM GOAL #1   Title Pt will be able to show independence with HEP for LE and core strengthening    Status Achieved      PT SHORT TERM GOAL #2   Title Pt will understand FOTO and potential to improve his condition within 3 visits    Status Achieved      PT SHORT TERM GOAL #3   Title Pt will consistently wear back brace and follow precuations as directed for safety with community mobility    Status Achieved  PT SHORT TERM GOAL #4   Title Pt will be able to walk without increased knee pain and improved stride length , LRAD    Baseline No pain but experiences knee buckling; still demonstrates decreased stride length    Status Partially Met             PT Long Term Goals - 06/21/20 1433      PT LONG TERM GOAL #1   Title Pt will improve FOTO to 58% ability or greater for improved functional mobility    Time 8    Period Weeks    Status New    Target Date 08/15/20      PT LONG TERM GOAL #2   Title Pt will be able to ambulate without a walker (cane) community distances with mod I    Time 8    Period Weeks    Status New    Target Date 08/15/20      PT LONG TERM GOAL #3   Title Pt will be able to increase UE and LE strength to 4+/5 to 5/5 for maximal functional mobility and strength    Time 8    Period Weeks    Status New    Target Date 08/15/20      PT  LONG TERM GOAL #4   Title Pt will be able to perform 5 x STS from standard chair, no UE needed in 15 sec or less    Baseline elevated mat, 17 sec with UE to walker    Time 8    Period Weeks    Status New    Target Date 08/15/20      PT LONG TERM GOAL #5   Title Pt will be I with final HEP upon discharge from PT    Time 8    Period Weeks    Status New    Target Date 08/15/20                 Plan - 07/25/20 1525    Clinical Impression Statement Patient arrives late but was able to work hard for length of session.  He was offered the NuStep Independently after session.  SLR improving but Rt knee still functionally weak.  Cont to use RW in comunity for safety.    PT Treatment/Interventions ADLs/Self Care Home Management;Cryotherapy;Gait training;Therapeutic exercise;Patient/family education;Taping;Manual techniques;Stair training;Balance training;Functional mobility training;Neuromuscular re-education;Passive range of motion;Therapeutic activities;DME Instruction    PT Next Visit Plan Add hip flexor off table to HEP.  Progress BLE strengthening; continue gait training with RW and consider gait training with cane, RUE strengthening (grip strength), manual therapy for B upper traps if indicated, core and glute strengthening, hip FL stretching. Consider treadmill, include Reformer    PT Home Exercise Plan [id number] - scapular retraction, TrA bracing, SLR, quad set, LAQ, bridge, grip squeeze with putty, heel raises, toe raises, resisted R ankle DF (red band)    Consulted and Agree with Plan of Care Patient           Patient will benefit from skilled therapeutic intervention in order to improve the following deficits and impairments:  Decreased activity tolerance,Decreased knowledge of use of DME,Decreased safety awareness,Decreased strength,Impaired flexibility,Impaired UE functional use,Pain,Obesity,Improper body mechanics,Decreased range of motion,Decreased scar mobility,Decreased  knowledge of precautions,Decreased balance,Decreased endurance,Decreased mobility,Decreased skin integrity,Difficulty walking,Impaired sensation  Visit Diagnosis: Status post cervical spinal fusion  Status post lumbar laminectomy  Muscle weakness (generalized)  Difficulty in walking, not elsewhere classified     Problem List  Patient Active Problem List   Diagnosis Date Noted  . Thoracic myelopathy 06/05/2020    PAA,JENNIFER 07/25/2020, 4:35 PM  [facility] [address] [place], [place], [address] Phone: [phone]   Fax:  [phone]  Name: HELDER CRISAFULLI MRN: [id number] Date of Birth: March 11, 1971  Raeford Razor, PT 07/25/20 4:37 PM Phone: [phone] Fax: [phone]

## 2020-08-17 ENCOUNTER — Ambulatory Visit: Payer: PRIVATE HEALTH INSURANCE | Admitting: Physical Therapy

## 2020-08-27 ENCOUNTER — Ambulatory Visit: Payer: PRIVATE HEALTH INSURANCE

## 2020-08-29 ENCOUNTER — Other Ambulatory Visit: Payer: Self-pay

## 2020-08-29 ENCOUNTER — Ambulatory Visit: Payer: PRIVATE HEALTH INSURANCE | Attending: Neurosurgery

## 2020-08-29 DIAGNOSIS — M6281 Muscle weakness (generalized): Secondary | ICD-10-CM | POA: Insufficient documentation

## 2020-08-29 DIAGNOSIS — Z9889 Other specified postprocedural states: Secondary | ICD-10-CM | POA: Diagnosis present

## 2020-08-29 DIAGNOSIS — Z981 Arthrodesis status: Secondary | ICD-10-CM | POA: Diagnosis present

## 2020-08-29 DIAGNOSIS — R262 Difficulty in walking, not elsewhere classified: Secondary | ICD-10-CM | POA: Diagnosis present

## 2020-08-29 NOTE — Therapy (Signed)
[place], [place], [address] Phone: [phone]   Fax:  [phone]  Physical Therapy Treatment  Patient Details  Name: Randy Stewart MRN: [id number] Date of Birth: 1971-06-14 Referring Provider (PT): Dr. Consuella Lose   Encounter Date: 08/29/2020   PT End of Session - 08/29/20 1052    Visit Number 10    Number of Visits 16    Date for PT Re-Evaluation 10/13/20    Authorization Type Generic Commercial    PT Start Time 1048    PT Stop Time 1130    PT Time Calculation (min) 42 min    Activity Tolerance Patient tolerated treatment well    Behavior During Therapy [facility] for tasks assessed/performed           Past Medical History:  Diagnosis Date  . Anxiety   . Hypertension     Past Surgical History:  Procedure Laterality Date  . ANTERIOR CERVICAL DECOMP/DISCECTOMY FUSION N/A 06/08/2020   Procedure: Anterior Cervical Discectomy Fusion Cervical Four-Five, Cervical Five-Six;  Surgeon: Consuella Lose, MD;  Location: [place];  Service: Neurosurgery;  Laterality: N/A;  . LUMBAR LAMINECTOMY/DECOMPRESSION MICRODISCECTOMY N/A 06/06/2020   Procedure: LUMBAR LAMINECTOMY THORACIC ELEVEN-TWELVE;  Surgeon: Consuella Lose, MD;  Location: [place];  Service: Neurosurgery;  Laterality: N/A;  . NO PAST SURGERIES    . ORIF ANKLE FRACTURE Left 11/30/2018   Procedure: OPEN REDUCTION INTERNAL FIXATION (ORIF) left ankle lateral malleolar fracture;  Surgeon: Wylene Simmer, MD;  Location: [place];  Service: Orthopedics;  Laterality: Left;  36mn    There were no vitals filed for this visit.   Subjective Assessment - 08/29/20 1052    Subjective Pt reports "doing pretty good, just a 1/10 because of that right leg"    Patient is accompained by: Family member   wife   Pertinent History L ankle ORIF 2019.  T11-T12 laminectomy .  Cervical fusion C4-C6    Limitations Sitting;Walking;Lifting;House hold  activities;Standing    How long can you sit comfortably? no pain    How long can you stand comfortably? has not pushed this beyond household    How long can you walk comfortably? limited by L knee    Diagnostic tests Pre and post operative    Patient Stated Goals Get stronger and  get back to work.    Currently in Pain? Yes    Pain Score 1     Pain Location Leg    Pain Orientation Right;Posterior;Lateral    Pain Descriptors / Indicators Aching;Tightness    Pain Onset More than a month ago    Pain Onset 1 to 4 weeks ago              [facility]      Assessment   Medical Diagnosis Lumbar laminectomy, cervical fusion     Referring Provider (PT) Dr. NConsuella Lose   Onset Date/Surgical Date 06/08/20      Observation/Other Assessments   Focus on Therapeutic Outcomes (FOTO)  26% functional status; predicted 58% function   pt explains answering "unable to perform" for several questions during this intake because tasks that were asked about including activities he has not been performing due to precautions/restrictions such as driving and lifting heavier objects.     Strength   Right Shoulder Flexion 3+/5    Right Shoulder ABduction 3+/5    Left Shoulder Flexion 3+/5    Left Shoulder ABduction 3+/5  Right Hand Grip (lbs) 35    Left Hand Grip (lbs) 65    Right Hip Flexion 3/5    Left Hip Flexion 3+/5    Right Knee Flexion 4+/5    Right Knee Extension 4/5    Left Knee Flexion 5/5    Left Knee Extension 5/5    Right Ankle Dorsiflexion 4/5    Left Ankle Dorsiflexion 5/5                         [facility] Adult PT Treatment/Exercise - 08/29/20 0001      Pilates   Pilates Reformer See patient instructions      Lumbar Exercises: Stretches   Passive Hamstring Stretch Right;Left;2 reps;30 seconds    Passive Hamstring Stretch Limitations seated at EOM      Lumbar Exercises: Aerobic   Nustep L5 x 6 min LE only      Lumbar Exercises:  Supine   Bridge with Ball Squeeze 10 reps   2 x 10     Knee/Hip Exercises: Stretches   Hip Flexor Stretch Limitations Attempted Thomas stretch but pt reports not feeling much of stretch along R anterior hip/thigh even with cues to keep low back against mat and with PT knee FL overpressure within tolerance    Other Knee/Hip Stretches Hip FL lunge stretch in parallel bars                  PT Education - 08/29/20 1414    Education Details HEP and continuing to practice sit<>stand with limited to no assist from [employer]) Educated Patient    Methods Explanation;Demonstration    Comprehension Verbalized understanding;Returned demonstration            PT Short Term Goals - 08/29/20 1423      PT SHORT TERM GOAL #1   Title Pt will be able to show independence with HEP for LE and core strengthening    Status Achieved      PT SHORT TERM GOAL #2   Title Pt will understand FOTO and potential to improve his condition within 3 visits    Status Achieved      PT SHORT TERM GOAL #3   Title Pt will consistently wear back brace and follow precuations as directed for safety with community mobility    Status Achieved      PT SHORT TERM GOAL #4   Title Pt will be able to walk without increased knee pain and improved stride length , LRAD    Baseline No pain but experiences knee buckling; still demonstrates decreased stride length    Status Partially Met             PT Long Term Goals - 08/29/20 1423      PT LONG TERM GOAL #1   Title Pt will improve FOTO to 58% ability or greater for improved functional mobility    Time 8    Period Weeks    Status On-going      PT LONG TERM GOAL #2   Title Pt will be able to ambulate without a walker (cane) community distances with mod I    Time 8    Period Weeks    Status On-going      PT LONG TERM GOAL #3   Title Pt will be able to increase UE and LE strength to 4+/5 to 5/5 for maximal functional mobility and strength    Time 8  Period Weeks    Status On-going      PT LONG TERM GOAL #4   Title Pt will be able to perform 5 x STS from standard chair, no UE needed in 15 sec or less    Baseline elevated mat, 17 sec with UE to walker. 08/29/2020: UE assist during sit<>stand this session    Time 8    Period Weeks    Status On-going      PT LONG TERM GOAL #5   Title Pt will be I with final HEP upon discharge from PT    Time 8    Period Weeks    Status On-going                 Plan - 08/29/20 1054    Clinical Impression Statement Patient tolerated treatment session well with no adverse effects or complaints of pain. FOTO score reflects significantly decreased functional status secondary to questions not being applicable (see flowsheet). He continues to have decreased BUE and BLE strength, especially on right side. Pt presents with continued limitations in functional strength, requiring BUE assist during sit<>stand from low mat this session. He is able to perform with greater ease than previously but should benefit from continued skilled PT intervention to allow for functional strengthening and improved tolerance with daily activities and community ambulation.    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[employer];Reach Overhead;Stand;[place];Interpersonal Relationship;Occupation;Cleaning;Laundry;Community Activity;Driving;Meal Prep;Shop;Yard Work    Stability/Clinical Decision Making Stable/Uncomplicated    Rehab Potential Excellent    PT Frequency 2x / week    PT Duration 6 weeks    PT Treatment/Interventions ADLs/Self Care Home Management;Cryotherapy;Gait training;Therapeutic exercise;Patient/family education;Taping;Manual techniques;Stair  training;Balance training;Functional mobility training;Neuromuscular re-education;Passive range of motion;Therapeutic activities;DME Instruction    PT Next Visit Plan Progress BLE strengthening; continue gait training, RUE strengthening (grip and shoulder strength), core and glute strengthening, hip FL stretching. Research officer, political party. Review Thomas stretch again for technique    PT Home Exercise Plan [id number] - scapular retraction, TrA bracing, SLR, quad set, LAQ, bridge, grip squeeze with putty, heel raises, toe raises, resisted R ankle DF (red band), Thomas stretch if able to feel stretch along hip FL without pain in low back    Consulted and Agree with Plan of Care Patient           Patient will benefit from skilled therapeutic intervention in order to improve the following deficits and impairments:  Decreased activity tolerance,Decreased knowledge of use of DME,Decreased safety awareness,Decreased strength,Impaired flexibility,Impaired UE functional use,Pain,Obesity,Improper body mechanics,Decreased range of motion,Decreased scar mobility,Decreased knowledge of precautions,Decreased balance,Decreased endurance,Decreased mobility,Decreased skin integrity,Difficulty walking,Impaired sensation  Visit Diagnosis: Status post cervical spinal fusion  Status post lumbar laminectomy  Muscle weakness (generalized)  Difficulty in walking, not elsewhere classified     Problem List Patient Active Problem List   Diagnosis Date Noted  . Thoracic myelopathy 06/05/2020      Haydee Monica, PT, DPT 08/29/20 2:27 PM  [facility] [address] [place], [place], [address] Phone: [phone]   Fax:  [phone]  Name: TORIANO AIKEY MRN: [id number] Date of Birth: 25-Sep-1970

## 2020-08-29 NOTE — Patient Instructions (Addendum)
Pilates Reformer used for LE/core strength, postural strength, lumbopelvic disassociation and core control.  Exercises included:2 Red 1 Blue 1 Yellow                Double:             Parallel and Hip External rotation (natural position) from heels      Single : unable to maintain opposite leg in table top (90 deg) unassisted                         Pressing out 2 red 1 blue x 15 each LE

## 2020-09-03 ENCOUNTER — Ambulatory Visit: Payer: PRIVATE HEALTH INSURANCE

## 2020-09-03 ENCOUNTER — Other Ambulatory Visit: Payer: Self-pay

## 2020-09-03 DIAGNOSIS — Z981 Arthrodesis status: Secondary | ICD-10-CM

## 2020-09-03 DIAGNOSIS — Z9889 Other specified postprocedural states: Secondary | ICD-10-CM

## 2020-09-03 DIAGNOSIS — M6281 Muscle weakness (generalized): Secondary | ICD-10-CM

## 2020-09-03 DIAGNOSIS — R262 Difficulty in walking, not elsewhere classified: Secondary | ICD-10-CM

## 2020-09-03 NOTE — Patient Instructions (Addendum)
Pilates Reformer used for LE/core strength, postural strength, lumbopelvic disassociation and core control.  Exercises included:2 Red 1 Blue 1 Yellow    Double: 2 red, 1 blue spring Parallel and Hip External rotation (natural position) from heels  Single:   Unable to maintain opposite leg in table top (90 deg) unassisted on LLE but did better with RLE tabletop today Pressing out 2 red 1 blue x 15 each LE   Attempted bridges but pt with difficulty performing this session

## 2020-09-03 NOTE — Therapy (Signed)
[place], [place], [address] Phone: [phone]   Fax:  [phone]  Physical Therapy Treatment  Patient Details  Name: Randy Stewart MRN: [id number] Date of Birth: 1971/04/20 Referring Provider (PT): Dr. Consuella Lose   Encounter Date: 09/03/2020   PT End of Session - 09/03/20 0751    Visit Number 11    Number of Visits 16    Date for PT Re-Evaluation 10/13/20    Authorization Type Generic Commercial    PT Start Time 0751   pt arrived late   PT Stop Time 0829    PT Time Calculation (min) 38 min    Activity Tolerance Patient tolerated treatment well    Behavior During Therapy [facility] for tasks assessed/performed           Past Medical History:  Diagnosis Date  . Anxiety   . Hypertension     Past Surgical History:  Procedure Laterality Date  . ANTERIOR CERVICAL DECOMP/DISCECTOMY FUSION N/A 06/08/2020   Procedure: Anterior Cervical Discectomy Fusion Cervical Four-Five, Cervical Five-Six;  Surgeon: Consuella Lose, MD;  Location: [place];  Service: Neurosurgery;  Laterality: N/A;  . LUMBAR LAMINECTOMY/DECOMPRESSION MICRODISCECTOMY N/A 06/06/2020   Procedure: LUMBAR LAMINECTOMY THORACIC ELEVEN-TWELVE;  Surgeon: Consuella Lose, MD;  Location: [place];  Service: Neurosurgery;  Laterality: N/A;  . NO PAST SURGERIES    . ORIF ANKLE FRACTURE Left 11/30/2018   Procedure: OPEN REDUCTION INTERNAL FIXATION (ORIF) left ankle lateral malleolar fracture;  Surgeon: Wylene Simmer, MD;  Location: [place];  Service: Orthopedics;  Laterality: Left;  30mn    There were no vitals filed for this visit.   Subjective Assessment - 09/03/20 0755    Subjective "I'm just a little stiff this morning, not really any pain. I felt great after last time."    Patient is accompained by: Family member   wife   Pertinent History L ankle ORIF 2019.  T11-T12 laminectomy .  Cervical fusion C4-C6    Limitations  Sitting;Walking;Lifting;House hold activities;Standing    How long can you sit comfortably? no pain    How long can you stand comfortably? has not pushed this beyond household    How long can you walk comfortably? limited by L knee    Diagnostic tests Pre and post operative    Patient Stated Goals Get stronger and  get back to work.    Currently in Pain? No/denies    Pain Score 0-No pain    Pain Onset More than a month ago    Pain Onset 1 to 4 weeks ago              [facility]      Assessment   Medical Diagnosis Lumbar laminectomy, cervical fusion     Referring Provider (PT) Dr. NConsuella Lose   Onset Date/Surgical Date 06/08/20                         [facility] Adult PT Treatment/Exercise - 09/03/20 0001      Lumbar Exercises: Stretches   Passive Hamstring Stretch Right;Left;2 reps;30 seconds    Passive [facility]string Stretch [facility]   Hip Flexor Stretch Limitations Lunge stretch in parallel bars x 30 sec each LE      Lumbar Exercises: Aerobic   Nustep L6 x 6 min LE only      Lumbar Exercises: Seated   Sit to Stand  15 reps   2 x 15   Sit to Stand Limitations from low mat; pt required min UE assist      Shoulder Exercises: Standing   Flexion Limitations FL and scaption 2 x 15 each with 2# dumbbells                  PT Education - 09/03/20 1224    Education Details Reviewed HEP. Pt expressed difficulty pushing through at terminal R knee extension especially when standing and during double/single leg work on Research officer, political party - advised him to continue practicing sit<>stand and also LAQ with ER for emphasis on VMO activation.    Person(s) Educated Patient    Methods Explanation;Demonstration    Comprehension Verbalized understanding;Returned demonstration            PT Short Term Goals - 08/29/20 1423      PT SHORT TERM GOAL #1   Title Pt will be able to show independence with HEP for LE and core  strengthening    Status Achieved      PT SHORT TERM GOAL #2   Title Pt will understand FOTO and potential to improve his condition within 3 visits    Status Achieved      PT SHORT TERM GOAL #3   Title Pt will consistently wear back brace and follow precuations as directed for safety with community mobility    Status Achieved      PT SHORT TERM GOAL #4   Title Pt will be able to walk without increased knee pain and improved stride length , LRAD    Baseline No pain but experiences knee buckling; still demonstrates decreased stride length    Status Partially Met             PT Long Term Goals - 08/29/20 1423      PT LONG TERM GOAL #1   Title Pt will improve FOTO to 58% ability or greater for improved functional mobility    Time 8    Period Weeks    Status On-going      PT LONG TERM GOAL #2   Title Pt will be able to ambulate without a walker (cane) community distances with mod I    Time 8    Period Weeks    Status On-going      PT LONG TERM GOAL #3   Title Pt will be able to increase UE and LE strength to 4+/5 to 5/5 for maximal functional mobility and strength    Time 8    Period Weeks    Status On-going      PT LONG TERM GOAL #4   Title Pt will be able to perform 5 x STS from standard chair, no UE needed in 15 sec or less    Baseline elevated mat, 17 sec with UE to walker. 08/29/2020: UE assist during sit<>stand this session    Time 8    Period Weeks    Status On-going      PT LONG TERM GOAL #5   Title Pt will be I with final HEP upon discharge from PT    Time 8    Period Weeks    Status On-going                 Plan - 09/03/20 0756    Clinical Impression Statement Patient tolerated session well with no adverse effects or complaints of pain. He was able to demonstrate improved ability to maintain tabletop position  with RLE during single leg work on Research officer, political party. He continues to have difficulty maintaining LLE in tabletop position due to his focus and  difficulty pushing through RLE but is able to maintain better with cues. Incorporated bilateral shoulder flexion and scaption with 1# to address BUE weakness during which pt experienced fatigue but was able to complete.    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[employer];Reach Overhead;Stand;[place];Interpersonal Relationship;Occupation;Cleaning;Laundry;Community Activity;Driving;Meal Prep;Shop;Yard Work    Stability/Clinical Decision Making Stable/Uncomplicated    Rehab Potential Excellent    PT Frequency 2x / week    PT Duration 6 weeks    PT Treatment/Interventions ADLs/Self Care Home Management;Cryotherapy;Gait training;Therapeutic exercise;Patient/family education;Taping;Manual techniques;Stair training;Balance training;Functional mobility training;Neuromuscular re-education;Passive range of motion;Therapeutic activities;DME Instruction    PT Next Visit Plan Progress BLE strengthening; continue gait training, RUE strengthening (grip and shoulder strength), core and glute strengthening, hip FL stretching. Progress interventions on Research officer, political party.    PT Home Exercise Plan [id number] - scapular retraction, TrA bracing, SLR, quad set, LAQ, bridge, grip squeeze with putty, heel raises, toe raises, resisted R ankle DF (red band), Thomas stretch if able to feel stretch along hip FL without pain in low back    Consulted and Agree with Plan of Care Patient           Patient will benefit from skilled therapeutic intervention in order to improve the following deficits and impairments:  Decreased activity tolerance,Decreased knowledge of use of DME,Decreased safety awareness,Decreased strength,Impaired flexibility,Impaired UE functional  use,Pain,Obesity,Improper body mechanics,Decreased range of motion,Decreased scar mobility,Decreased knowledge of precautions,Decreased balance,Decreased endurance,Decreased mobility,Decreased skin integrity,Difficulty walking,Impaired sensation  Visit Diagnosis: Status post cervical spinal fusion  Status post lumbar laminectomy  Muscle weakness (generalized)  Difficulty in walking, not elsewhere classified     Problem List Patient Active Problem List   Diagnosis Date Noted  . Thoracic myelopathy 06/05/2020      Haydee Monica, PT, DPT 09/03/20 12:35 PM  [place] [facility] [address] [place], [place], [address] Phone: [phone]   Fax:  [phone]  Name: Randy Stewart MRN: [id number] Date of Birth: 10/09/1970

## 2020-09-05 ENCOUNTER — Ambulatory Visit: Payer: PRIVATE HEALTH INSURANCE | Attending: Neurosurgery | Admitting: Physical Therapy

## 2020-09-05 ENCOUNTER — Encounter: Payer: Self-pay | Admitting: Physical Therapy

## 2020-09-05 ENCOUNTER — Other Ambulatory Visit: Payer: Self-pay

## 2020-09-05 DIAGNOSIS — Z9889 Other specified postprocedural states: Secondary | ICD-10-CM | POA: Insufficient documentation

## 2020-09-05 DIAGNOSIS — M6281 Muscle weakness (generalized): Secondary | ICD-10-CM | POA: Insufficient documentation

## 2020-09-05 DIAGNOSIS — R262 Difficulty in walking, not elsewhere classified: Secondary | ICD-10-CM | POA: Diagnosis present

## 2020-09-05 DIAGNOSIS — Z981 Arthrodesis status: Secondary | ICD-10-CM | POA: Insufficient documentation

## 2020-09-05 NOTE — Therapy (Signed)
[place], [place], [address] Phone: [phone]   Fax:  [phone]  Physical Therapy Treatment  Patient Details  Name: Randy Stewart MRN: [id number] Date of Birth: [birth year] Referring Provider (PT): Dr. Consuella Lose   Encounter Date: 09/05/2020   PT End of Session - 09/05/20 0921    Visit Number 12    Number of Visits 16    Date for PT Re-Evaluation 10/13/20    Authorization Type Generic Commercial    Authorization Time Period 08/04/20 to 11/26/20 (12 visits)    Authorization - Visit Number 3    Authorization - Number of Visits 12    PT Start Time [phone]    Activity Tolerance Patient tolerated treatment well    Behavior During Therapy [facility] for tasks assessed/performed           Past Medical History:  Diagnosis Date  . Anxiety   . Hypertension     Past Surgical History:  Procedure Laterality Date  . ANTERIOR CERVICAL DECOMP/DISCECTOMY FUSION N/A 06/08/2020   Procedure: Anterior Cervical Discectomy Fusion Cervical Four-Five, Cervical Five-Six;  Surgeon: Consuella Lose, MD;  Location: [place];  Service: Neurosurgery;  Laterality: N/A;  . LUMBAR LAMINECTOMY/DECOMPRESSION MICRODISCECTOMY N/A 06/06/2020   Procedure: LUMBAR LAMINECTOMY THORACIC ELEVEN-TWELVE;  Surgeon: Consuella Lose, MD;  Location: [place];  Service: Neurosurgery;  Laterality: N/A;  . NO PAST SURGERIES    . ORIF ANKLE FRACTURE Left 11/30/2018   Procedure: OPEN REDUCTION INTERNAL FIXATION (ORIF) left ankle lateral malleolar fracture;  Surgeon: Wylene Simmer, MD;  Location: [place];  Service: Orthopedics;  Laterality: Left;  73min    There were no vitals filed for this visit.   Subjective Assessment - 09/05/20 0920    Subjective Just tight today, Rt hip.  No pain .    Currently in Pain? No/denies               [facility] Adult PT Treatment/Exercise - 09/05/20 0001      Lumbar Exercises: Stretches   Passive  Hamstring Stretch 5 reps;20 seconds    Passive Hamstring Stretch Limitations by PT    Lower Trunk Rotation 10 seconds;5 reps    Lower Trunk Rotation Limitations figure 4    Piriformis Stretch 3 reps;20 seconds    Piriformis Stretch Limitations knee press away      Lumbar Exercises: Standing   Row Both;Theraband;15 reps    Theraband Level (Row) Level 3 (Green)    Shoulder Extension Both;15 reps;Theraband    Theraband Level (Shoulder Extension) Level 3 (Green)    Other Standing Lumbar Exercises Palloff press green x 15 each side and then small range rotation      Knee/Hip Exercises: Standing   Functional Squat 10 reps    Functional Squat Limitations small ROM with walker      Knee/Hip Exercises: Seated   Long Arc Quad Strengthening;Right;2 sets;10 reps    Long Arc Quad Weight 8 lbs.    Sit to Sand 2 sets;10 reps;with UE support      Manual Therapy   Soft tissue mobilization Rt glute medius, piriformis, very sore with trigger points                    PT Short Term Goals - 08/29/20 1423      PT SHORT TERM GOAL #1   Title Pt will be able to show independence with HEP for LE and core strengthening    Status Achieved  PT SHORT TERM GOAL #2   Title Pt will understand FOTO and potential to improve his condition within 3 visits    Status Achieved      PT SHORT TERM GOAL #3   Title Pt will consistently wear back brace and follow precuations as directed for safety with community mobility    Status Achieved      PT SHORT TERM GOAL #4   Title Pt will be able to walk without increased knee pain and improved stride length , LRAD    Baseline No pain but experiences knee buckling; still demonstrates decreased stride length    Status Partially Met             PT Long Term Goals - 08/29/20 1423      PT LONG TERM GOAL #1   Title Pt will improve FOTO to 58% ability or greater for improved functional mobility    Time 8    Period Weeks    Status On-going      PT  LONG TERM GOAL #2   Title Pt will be able to ambulate without a walker (cane) community distances with mod I    Time 8    Period Weeks    Status On-going      PT LONG TERM GOAL #3   Title Pt will be able to increase UE and LE strength to 4+/5 to 5/5 for maximal functional mobility and strength    Time 8    Period Weeks    Status On-going      PT LONG TERM GOAL #4   Title Pt will be able to perform 5 x STS from standard chair, no UE needed in 15 sec or less    Baseline elevated mat, 17 sec with UE to walker. 08/29/2020: UE assist during sit<>stand this session    Time 8    Period Weeks    Status On-going      PT LONG TERM GOAL #5   Title Pt will be I with final HEP upon discharge from PT    Time 8    Period Weeks    Status On-going                 Plan - 09/05/20 9713    PT Treatment/Interventions ADLs/Self Care Home Management;Cryotherapy;Gait training;Therapeutic exercise;Patient/family education;Taping;Manual techniques;Stair training;Balance training;Functional mobility training;Neuromuscular re-education;Passive range of motion;Therapeutic activities;DME Instruction    PT Next Visit Plan Progress BLE strengthening; continue gait training, RUE strengthening (grip and shoulder strength), core and glute strengthening, hip FL stretching. Progress interventions on Land.    PT Home Exercise Plan [id number] - scapular retraction, TrA bracing, SLR, quad set, LAQ, bridge, grip squeeze with putty, heel raises, toe raises, resisted R ankle DF (red band), Thomas stretch if able to feel stretch along hip FL without pain in low back           Patient will benefit from skilled therapeutic intervention in order to improve the following deficits and impairments:  Decreased activity tolerance,Decreased knowledge of use of DME,Decreased safety awareness,Decreased strength,Impaired flexibility,Impaired UE functional use,Pain,Obesity,Improper body mechanics,Decreased range of  motion,Decreased scar mobility,Decreased knowledge of precautions,Decreased balance,Decreased endurance,Decreased mobility,Decreased skin integrity,Difficulty walking,Impaired sensation  Visit Diagnosis: Status post cervical spinal fusion  Status post lumbar laminectomy  Muscle weakness (generalized)  Difficulty in walking, not elsewhere classified     Problem List Patient Active Problem List   Diagnosis Date Noted  . Thoracic myelopathy 06/05/2020    Andilyn Bettcher 09/05/2020, 12:35 PM  [place], [place], [address] Phone: [phone]   Fax:  [phone]  Name: CLAYDEN WITHEM MRN: [id number] Date of Birth: [birth year]  Raeford Razor, PT 09/05/20 12:35 PM Phone: [phone] Fax: [phone]

## 2020-09-10 ENCOUNTER — Ambulatory Visit: Payer: PRIVATE HEALTH INSURANCE

## 2020-09-12 ENCOUNTER — Ambulatory Visit: Payer: PRIVATE HEALTH INSURANCE | Admitting: Physical Therapy

## 2020-09-12 ENCOUNTER — Other Ambulatory Visit: Payer: Self-pay

## 2020-09-12 DIAGNOSIS — M6281 Muscle weakness (generalized): Secondary | ICD-10-CM

## 2020-09-12 DIAGNOSIS — Z981 Arthrodesis status: Secondary | ICD-10-CM | POA: Diagnosis not present

## 2020-09-12 DIAGNOSIS — R262 Difficulty in walking, not elsewhere classified: Secondary | ICD-10-CM

## 2020-09-12 DIAGNOSIS — Z9889 Other specified postprocedural states: Secondary | ICD-10-CM

## 2020-09-12 NOTE — Therapy (Signed)
[place], [place], [address] Phone: [phone]   Fax:  [phone]  Physical Therapy Treatment  Patient Details  Name: Randy Stewart MRN: [id number] Date of Birth: 1971-06-23 Referring Provider (PT): Dr. Consuella Lose   Encounter Date: 09/12/2020   PT End of Session - 09/12/20 0840    Visit Number 13    Number of Visits 16    Date for PT Re-Evaluation 10/13/20    Authorization Type Generic Commercial    Authorization Time Period 08/04/20 to 11/26/20 (12 visits)    Authorization - Visit Number 4    Authorization - Number of Visits 12    PT Start Time 0830    PT Stop Time 0918    PT Time Calculation (min) 48 min    Activity Tolerance Patient tolerated treatment well    Behavior During Therapy [facility] for tasks assessed/performed           Past Medical History:  Diagnosis Date  . Anxiety   . Hypertension     Past Surgical History:  Procedure Laterality Date  . ANTERIOR CERVICAL DECOMP/DISCECTOMY FUSION N/A 06/08/2020   Procedure: Anterior Cervical Discectomy Fusion Cervical Four-Five, Cervical Five-Six;  Surgeon: Consuella Lose, MD;  Location: [place];  Service: Neurosurgery;  Laterality: N/A;  . LUMBAR LAMINECTOMY/DECOMPRESSION MICRODISCECTOMY N/A 06/06/2020   Procedure: LUMBAR LAMINECTOMY THORACIC ELEVEN-TWELVE;  Surgeon: Consuella Lose, MD;  Location: [facility];  Service: Neurosurgery;  Laterality: N/A;  . NO PAST SURGERIES    . ORIF ANKLE FRACTURE Left 11/30/2018   Procedure: OPEN REDUCTION INTERNAL FIXATION (ORIF) left ankle lateral malleolar fracture;  Surgeon: Wylene Simmer, MD;  Location: [facility];  Service: Orthopedics;  Laterality: Left;  35min    There were no vitals filed for this visit.   Subjective Assessment - 09/12/20 0839    Subjective Tightness in both thighs.  No pain really.  Hate that I missed Monday due to transportation. Id like to see if I can use the  Reformer better.    Currently in Pain? No/denies              [facility] PT Assessment - 09/12/20 0001      Assessment   Next MD Visit 09/19/20            [facility] Adult PT Treatment/Exercise - 09/12/20 0001      Pilates   Pilates Reformer See note           Pilates Reformer used for LE/core strength, postural strength, lumbopelvic disassociation and core control.  Exercises included:  Footwork 2 Red 1 green 1 blue   Double leg press on heels 2 x20 reps   Calf stretching , running to heel raise x 10   Single leg press 2 red 1 blue 1 yellow x 10 each  Feet in Straps  Frog squats 1 Red 1 Yellow , PT assist at heels , unable to independently arc legs up to stretch hamstrings bilaterally.   Standing Scooter attempted but not safe  Standing in parallel bars   Towel slides back lunge x 15 each LE  Rt leg supporting 2 x 5 due to fatigue        PT Education - 09/12/20 1346    Education Details Randy Stewart had no pain today, has developed Rt leg soreness which can be from increased activity.  He is now able to hold leg without support during supine Pilates Reformer exercise. He did need support for  feet in straps.  Able to work in standing for bilateral LE strength with increased fatigue, walks out of clinic with improved coordination and efficiency.            PT Short Term Goals - 08/29/20 1423      PT SHORT TERM GOAL #1   Title Pt will be able to show independence with HEP for LE and core strengthening    Status Achieved      PT SHORT TERM GOAL #2   Title Pt will understand FOTO and potential to improve his condition within 3 visits    Status Achieved      PT SHORT TERM GOAL #3   Title Pt will consistently wear back brace and follow precuations as directed for safety with community mobility    Status Achieved      PT SHORT TERM GOAL #4   Title Pt will be able to walk without increased knee pain and improved stride length , LRAD    Baseline No pain but experiences knee  buckling; still demonstrates decreased stride length    Status Partially Met             PT Long Term Goals - 08/29/20 1423      PT LONG TERM GOAL #1   Title Pt will improve FOTO to 58% ability or greater for improved functional mobility    Time 8    Period Weeks    Status On-going      PT LONG TERM GOAL #2   Title Pt will be able to ambulate without a walker (cane) community distances with mod I    Time 8    Period Weeks    Status On-going      PT LONG TERM GOAL #3   Title Pt will be able to increase UE and LE strength to 4+/5 to 5/5 for maximal functional mobility and strength    Time 8    Period Weeks    Status On-going      PT LONG TERM GOAL #4   Title Pt will be able to perform 5 x STS from standard chair, no UE needed in 15 sec or less    Baseline elevated mat, 17 sec with UE to walker. 08/29/2020: UE assist during sit<>stand this session    Time 8    Period Weeks    Status On-going      PT LONG TERM GOAL #5   Title Pt will be I with final HEP upon discharge from PT    Time 8    Period Weeks    Status On-going                 Plan - 09/12/20 0855    Clinical Impression Statement Patient concerned he did not have appts for the rest of Feb.  Will work him in to make this happen. Improving in strength functionally, Rt LE fatigued from more challenging standing ther ex. No pain during session.    Comorbidities anxiety, HTN, obesity    Examination-Activity Limitations Bathing;Dressing;Sit;Transfers;Sleep;Hygiene/Grooming;Bed Mobility;Bend;Lift;Squat;Stairs;Patent attorney for [employer];Reach Overhead;Stand;Toileting    PT Treatment/Interventions ADLs/Self Care Home Management;Cryotherapy;Gait training;Therapeutic exercise;Patient/family education;Taping;Manual techniques;Stair training;Balance training;Functional mobility training;Neuromuscular re-education;Passive range of motion;Therapeutic activities;DME Instruction    PT Next Visit Plan Progress  BLE strengthening; continue gait training, RUE strengthening (grip and shoulder strength), core and glute strengthening, hip FL stretching. Progress interventions on Research officer, political party.    PT Home Exercise Plan [id number] - scapular retraction, TrA bracing, SLR, quad  set, LAQ, bridge, grip squeeze with putty, heel raises, toe raises, resisted R ankle DF (red band), Thomas stretch if able to feel stretch along hip FL without pain in low back    Consulted and Agree with Plan of Care Patient           Patient will benefit from skilled therapeutic intervention in order to improve the following deficits and impairments:  Decreased activity tolerance,Decreased knowledge of use of DME,Decreased safety awareness,Decreased strength,Impaired flexibility,Impaired UE functional use,Pain,Obesity,Improper body mechanics,Decreased range of motion,Decreased scar mobility,Decreased knowledge of precautions,Decreased balance,Decreased endurance,Decreased mobility,Decreased skin integrity,Difficulty walking,Impaired sensation  Visit Diagnosis: Status post cervical spinal fusion  Status post lumbar laminectomy  Muscle weakness (generalized)  Difficulty in walking, not elsewhere classified     Problem List Patient Active Problem List   Diagnosis Date Noted  . Thoracic myelopathy 06/05/2020    Torrell Krutz 09/12/2020, 1:50 PM  [facility] [address] [place], [place], [address] Phone: [phone]   Fax:  [phone]  Name: Randy Stewart MRN: [id number] Date of Birth: Jul 29, 1971  Raeford Razor, PT 09/12/20 1:53 PM Phone: [phone] Fax: [phone]

## 2020-09-17 ENCOUNTER — Encounter: Payer: Self-pay | Admitting: Physical Therapy

## 2020-09-17 ENCOUNTER — Ambulatory Visit: Payer: PRIVATE HEALTH INSURANCE | Admitting: Physical Therapy

## 2020-09-17 ENCOUNTER — Other Ambulatory Visit: Payer: Self-pay

## 2020-09-17 DIAGNOSIS — Z981 Arthrodesis status: Secondary | ICD-10-CM | POA: Diagnosis not present

## 2020-09-17 DIAGNOSIS — Z9889 Other specified postprocedural states: Secondary | ICD-10-CM

## 2020-09-17 DIAGNOSIS — R262 Difficulty in walking, not elsewhere classified: Secondary | ICD-10-CM

## 2020-09-17 DIAGNOSIS — M6281 Muscle weakness (generalized): Secondary | ICD-10-CM

## 2020-09-17 NOTE — Therapy (Signed)
[place], [place], [address] Phone: [phone]   Fax:  [phone]  Physical Therapy Treatment  Patient Details  Name: Randy Stewart MRN: [id number] Date of Birth: 08-02-1971 Referring Provider (PT): Dr. Consuella Lose   Encounter Date: 09/17/2020   PT End of Session - 09/17/20 0859    Visit Number 14    Number of Visits 16    Date for PT Re-Evaluation 10/13/20    Authorization Type Generic Commercial    Authorization Time Period 08/04/20 to 11/26/20 (12 visits)    Authorization - Visit Number 5    Authorization - Number of Visits 12    PT Start Time 0845    PT Stop Time 0930    PT Time Calculation (min) 45 min           Past Medical History:  Diagnosis Date  . Anxiety   . Hypertension     Past Surgical History:  Procedure Laterality Date  . ANTERIOR CERVICAL DECOMP/DISCECTOMY FUSION N/A 06/08/2020   Procedure: Anterior Cervical Discectomy Fusion Cervical Four-Five, Cervical Five-Six;  Surgeon: Consuella Lose, MD;  Location: [facility];  Service: Neurosurgery;  Laterality: N/A;  . LUMBAR LAMINECTOMY/DECOMPRESSION MICRODISCECTOMY N/A 06/06/2020   Procedure: LUMBAR LAMINECTOMY THORACIC ELEVEN-TWELVE;  Surgeon: Consuella Lose, MD;  Location: [facility];  Service: Neurosurgery;  Laterality: N/A;  . NO PAST SURGERIES    . ORIF ANKLE FRACTURE Left 11/30/2018   Procedure: OPEN REDUCTION INTERNAL FIXATION (ORIF) left ankle lateral malleolar fracture;  Surgeon: Wylene Simmer, MD;  Location: [facility];  Service: Orthopedics;  Laterality: Left;  35min    There were no vitals filed for this visit.   Subjective Assessment - 09/17/20 0857    Subjective I am feeling stiff and tight, just a 1/10 pain in right thigh and hip.    Currently in Pain? Yes    Pain Score 1     Pain Location Leg    Pain Orientation Right;Lateral;Posterior    Pain Descriptors / Indicators Tightness    Aggravating  Factors  walking, exercises    Pain Relieving Factors rest              [facility] PT Assessment - 09/17/20 0001      Strength   Right Hip Flexion 3/5    Left Hip Flexion 3+/5                         [facility] Adult PT Treatment/Exercise - 09/17/20 0001      Lumbar Exercises: Aerobic   Nustep L6 x 6 min LE only      Lumbar Exercises: Standing   Row Both;Theraband;15 reps    Theraband Level (Row) Level 3 (Green)    Shoulder Extension Both;15 reps;Theraband    Theraband Level (Shoulder Extension) Level 3 (Green)    Other Standing Lumbar Exercises Palloff press green x 15 each side and then small range rotation      Knee/Hip Exercises: Machines for Strengthening   Cybex Leg Press 2 plates bilateral 15 x 2      Knee/Hip Exercises: Seated   Long Arc Quad Strengthening;Right;2 sets;10 reps    Long Arc Quad Weight 8 lbs.    Marching 10 reps;2 sets    Sit to Sand 1 set;10 reps                    PT Short Term Goals - 08/29/20 1423  PT SHORT TERM GOAL #1   Title Pt will be able to show independence with HEP for LE and core strengthening    Status Achieved      PT SHORT TERM GOAL #2   Title Pt will understand FOTO and potential to improve his condition within 3 visits    Status Achieved      PT SHORT TERM GOAL #3   Title Pt will consistently wear back brace and follow precuations as directed for safety with community mobility    Status Achieved      PT SHORT TERM GOAL #4   Title Pt will be able to walk without increased knee pain and improved stride length , LRAD    Baseline No pain but experiences knee buckling; still demonstrates decreased stride length    Status Partially Met             PT Long Term Goals - 08/29/20 1423      PT LONG TERM GOAL #1   Title Pt will improve FOTO to 58% ability or greater for improved functional mobility    Time 8    Period Weeks    Status On-going      PT LONG TERM GOAL #2   Title Pt will be able to  ambulate without a walker (cane) community distances with mod I    Time 8    Period Weeks    Status On-going      PT LONG TERM GOAL #3   Title Pt will be able to increase UE and LE strength to 4+/5 to 5/5 for maximal functional mobility and strength    Time 8    Period Weeks    Status On-going      PT LONG TERM GOAL #4   Title Pt will be able to perform 5 x STS from standard chair, no UE needed in 15 sec or less    Baseline elevated mat, 17 sec with UE to walker. 08/29/2020: UE assist during sit<>stand this session    Time 8    Period Weeks    Status On-going      PT LONG TERM GOAL #5   Title Pt will be I with final HEP upon discharge from PT    Time 8    Period Weeks    Status On-going                 Plan - 09/17/20 0946    Clinical Impression Statement Pt reports tightness today, otherwise doing okay. Began leg press with no increased pain. Continued with standing scapular strength/core exercise. He reported leg fatigue otherwise no pain. His hip strength is unchanged. Began seated hip flexion with patient reporting increased fatigue on right. Continued with sit-stands and pt required elevated mat table to perform with UE support today.    PT Next Visit Plan Progress BLE strengthening; continue gait training, RUE strengthening (grip and shoulder strength), core and glute strengthening, hip FL stretching. Progress interventions on Land.    PT Home Exercise Plan [id number] - scapular retraction, TrA bracing, SLR, quad set, LAQ, bridge, grip squeeze with putty, heel raises, toe raises, resisted R ankle DF (red band), Thomas stretch if able to feel stretch along hip FL without pain in low back           Patient will benefit from skilled therapeutic intervention in order to improve the following deficits and impairments:  Decreased activity tolerance,Decreased knowledge of use of DME,Decreased safety awareness,Decreased strength,Impaired  flexibility,Impaired UE  functional use,Pain,Obesity,Improper body mechanics,Decreased range of motion,Decreased scar mobility,Decreased knowledge of precautions,Decreased balance,Decreased endurance,Decreased mobility,Decreased skin integrity,Difficulty walking,Impaired sensation  Visit Diagnosis: Status post cervical spinal fusion  Status post lumbar laminectomy  Muscle weakness (generalized)  Difficulty in walking, not elsewhere classified     Problem List Patient Active Problem List   Diagnosis Date Noted  . Thoracic myelopathy 06/05/2020    Dorene Ar, PTA 09/17/2020, 10:05 AM  [facility] [address] [place], [place], [address] Phone: [phone]   Fax:  [phone]  Name: Randy Stewart MRN: [id number] Date of Birth: 07-25-1971

## 2020-09-19 ENCOUNTER — Ambulatory Visit: Payer: PRIVATE HEALTH INSURANCE | Admitting: Physical Therapy

## 2020-09-19 ENCOUNTER — Other Ambulatory Visit: Payer: Self-pay

## 2020-09-19 ENCOUNTER — Encounter: Payer: Self-pay | Admitting: Physical Therapy

## 2020-09-19 DIAGNOSIS — Z981 Arthrodesis status: Secondary | ICD-10-CM

## 2020-09-19 DIAGNOSIS — Z9889 Other specified postprocedural states: Secondary | ICD-10-CM

## 2020-09-19 DIAGNOSIS — M6281 Muscle weakness (generalized): Secondary | ICD-10-CM

## 2020-09-19 DIAGNOSIS — R262 Difficulty in walking, not elsewhere classified: Secondary | ICD-10-CM

## 2020-09-20 NOTE — Therapy (Signed)
[place], [place], [address] Phone: [phone]   Fax:  [phone]  Physical Therapy Treatment  Patient Details  Name: Randy Stewart MRN: [id number] Date of Birth: 12/05/1970 Referring Provider (PT): Dr. Consuella Lose   Encounter Date: 09/19/2020   PT End of Session - 09/19/20 1649    Visit Number 15    Number of Visits 16    Date for PT Re-Evaluation 10/13/20    Authorization Type Generic Commercial    Authorization Time Period 08/04/20 to 11/26/20 (12 visits)    Authorization - Visit Number 6    Authorization - Number of Visits 12    PT Start Time 1619    PT Stop Time 1700    PT Time Calculation (min) 41 min    Activity Tolerance Patient tolerated treatment well    Behavior During Therapy [facility] for tasks assessed/performed           Past Medical History:  Diagnosis Date  . Anxiety   . Hypertension     Past Surgical History:  Procedure Laterality Date  . ANTERIOR CERVICAL DECOMP/DISCECTOMY FUSION N/A 06/08/2020   Procedure: Anterior Cervical Discectomy Fusion Cervical Four-Five, Cervical Five-Six;  Surgeon: Consuella Lose, MD;  Location: [place];  Service: Neurosurgery;  Laterality: N/A;  . LUMBAR LAMINECTOMY/DECOMPRESSION MICRODISCECTOMY N/A 06/06/2020   Procedure: LUMBAR LAMINECTOMY THORACIC ELEVEN-TWELVE;  Surgeon: Consuella Lose, MD;  Location: [place];  Service: Neurosurgery;  Laterality: N/A;  . NO PAST SURGERIES    . ORIF ANKLE FRACTURE Left 11/30/2018   Procedure: OPEN REDUCTION INTERNAL FIXATION (ORIF) left ankle lateral malleolar fracture;  Surgeon: Wylene Simmer, MD;  Location: [place];  Service: Orthopedics;  Laterality: Left;  104min    There were no vitals filed for this visit.   Subjective Assessment - 09/19/20 1633    Subjective No pain really.  Back was hurting this AM but i'm just tight from sitting for awhile.  I want to compare the strength of my legs.   Saw the surgeon and he said I was doing well .    Currently in Pain? No/denies             [facility] Adult PT Treatment/Exercise - 09/19/20 0001      Knee/Hip Exercises: Aerobic   Nustep L6 UE and LE for 5 min      Knee/Hip Exercises: Machines for Strengthening   Cybex Knee Extension 15 lbs x 20 bilateral, Rt LE only x 10 then 20 lbs x 15, then used Rt LE for eccentric lowering    Cybex Leg Press 2 plates x 15, 3 plates x 15 , then single leg 1 plate 2 x 10    Other Machine used blue band around thighs for another set with 2 plates      Knee/Hip Exercises: Seated   Long Arc Quad Strengthening;1 set    [employer] Limitations blue band looped    Clamshell with TheraBand --   blue band x 10   Marching 10 reps;2 sets    Marching Limitations blue band    Hamstring Curl Strengthening;Right;2 sets    Hamstring Limitations blue band                  PT Education - 09/20/20 0540    Education Details HEP bands for Rt knee strength    Person(s) Educated Patient    Methods Explanation    Comprehension Verbalized understanding  PT Short Term Goals - 08/29/20 1423      PT SHORT TERM GOAL #1   Title Pt will be able to show independence with HEP for LE and core strengthening    Status Achieved      PT SHORT TERM GOAL #2   Title Pt will understand FOTO and potential to improve his condition within 3 visits    Status Achieved      PT SHORT TERM GOAL #3   Title Pt will consistently wear back brace and follow precuations as directed for safety with community mobility    Status Achieved      PT SHORT TERM GOAL #4   Title Pt will be able to walk without increased knee pain and improved stride length , LRAD    Baseline No pain but experiences knee buckling; still demonstrates decreased stride length    Status Partially Met             PT Long Term Goals - 08/29/20 1423      PT LONG TERM GOAL #1   Title Pt will improve FOTO to 58% ability or greater for  improved functional mobility    Time 8    Period Weeks    Status On-going      PT LONG TERM GOAL #2   Title Pt will be able to ambulate without a walker (cane) community distances with mod I    Time 8    Period Weeks    Status On-going      PT LONG TERM GOAL #3   Title Pt will be able to increase UE and LE strength to 4+/5 to 5/5 for maximal functional mobility and strength    Time 8    Period Weeks    Status On-going      PT LONG TERM GOAL #4   Title Pt will be able to perform 5 x STS from standard chair, no UE needed in 15 sec or less    Baseline elevated mat, 17 sec with UE to walker. 08/29/2020: UE assist during sit<>stand this session    Time 8    Period Weeks    Status On-going      PT LONG TERM GOAL #5   Title Pt will be I with final HEP upon discharge from PT    Time 8    Period Weeks    Status On-going                 Plan - 09/20/20 0541    Clinical Impression Statement No pain reported today.  Focused on LE strength and comparison from Rt to Lt. He continues to be highly motivated and make functional progres with sit to stand and gait.    PT Treatment/Interventions ADLs/Self Care Home Management;Cryotherapy;Gait training;Therapeutic exercise;Patient/family education;Taping;Manual techniques;Stair training;Balance training;Functional mobility training;Neuromuscular re-education;Passive range of motion;Therapeutic activities;DME Instruction    PT Next Visit Plan Progress BLE strengthening; continue gait training, RUE strengthening (grip and shoulder strength), core and glute strengthening, hip FL stretching. Progress interventions on Land.    PT Home Exercise Plan [id number] - scapular retraction, TrA bracing, SLR, quad set, LAQ, bridge, grip squeeze with putty, heel raises, toe raises, resisted R ankle DF (red band), Thomas stretch if able to feel stretch along hip FL without pain in low back           Patient will benefit from skilled therapeutic  intervention in order to improve the following deficits and impairments:  Decreased activity  tolerance,Decreased knowledge of use of DME,Decreased safety awareness,Decreased strength,Impaired flexibility,Impaired UE functional use,Pain,Obesity,Improper body mechanics,Decreased range of motion,Decreased scar mobility,Decreased knowledge of precautions,Decreased balance,Decreased endurance,Decreased mobility,Decreased skin integrity,Difficulty walking,Impaired sensation  Visit Diagnosis: Status post cervical spinal fusion  Status post lumbar laminectomy  Muscle weakness (generalized)  Difficulty in walking, not elsewhere classified     Problem List Patient Active Problem List   Diagnosis Date Noted  . Thoracic myelopathy 06/05/2020    Mackie Holness 09/20/2020, 5:44 AM  [facility] [address] [place], [place], [address] Phone: [phone]   Fax:  [phone]  Name: OVA MEEGAN MRN: [id number] Date of Birth: 30-Mar-1971  Raeford Razor, PT 09/20/20 5:44 AM Phone: [phone] Fax: [phone]

## 2020-09-24 ENCOUNTER — Encounter: Payer: Self-pay | Admitting: Physical Therapy

## 2020-09-24 ENCOUNTER — Ambulatory Visit: Payer: PRIVATE HEALTH INSURANCE | Admitting: Physical Therapy

## 2020-09-24 ENCOUNTER — Other Ambulatory Visit: Payer: Self-pay

## 2020-09-24 DIAGNOSIS — Z981 Arthrodesis status: Secondary | ICD-10-CM | POA: Diagnosis not present

## 2020-09-24 DIAGNOSIS — M6281 Muscle weakness (generalized): Secondary | ICD-10-CM

## 2020-09-24 DIAGNOSIS — R262 Difficulty in walking, not elsewhere classified: Secondary | ICD-10-CM

## 2020-09-24 DIAGNOSIS — Z9889 Other specified postprocedural states: Secondary | ICD-10-CM

## 2020-09-24 NOTE — Therapy (Signed)
[place], [place], [address] Phone: [phone]   Fax:  [phone]  Physical Therapy Treatment  Patient Details  Name: Randy Stewart MRN: [id number] Date of Birth: 1971-02-20 Referring Provider (PT): Dr. Consuella Lose   Encounter Date: 09/24/2020   PT End of Session - 09/24/20 1109    Visit Number 16    Number of Visits 16    Date for PT Re-Evaluation 10/13/20    Authorization Type Generic Commercial    Authorization Time Period 08/04/20 to 11/26/20 (12 visits)    Authorization - Visit Number 7    Authorization - Number of Visits 12    PT Start Time 1102    PT Stop Time 1145    PT Time Calculation (min) 43 min           Past Medical History:  Diagnosis Date  . Anxiety   . Hypertension     Past Surgical History:  Procedure Laterality Date  . ANTERIOR CERVICAL DECOMP/DISCECTOMY FUSION N/A 06/08/2020   Procedure: Anterior Cervical Discectomy Fusion Cervical Four-Five, Cervical Five-Six;  Surgeon: Consuella Lose, MD;  Location: [place];  Service: Neurosurgery;  Laterality: N/A;  . LUMBAR LAMINECTOMY/DECOMPRESSION MICRODISCECTOMY N/A 06/06/2020   Procedure: LUMBAR LAMINECTOMY THORACIC ELEVEN-TWELVE;  Surgeon: Consuella Lose, MD;  Location: [place];  Service: Neurosurgery;  Laterality: N/A;  . NO PAST SURGERIES    . ORIF ANKLE FRACTURE Left 11/30/2018   Procedure: OPEN REDUCTION INTERNAL FIXATION (ORIF) left ankle lateral malleolar fracture;  Surgeon: Wylene Simmer, MD;  Location: [place];  Service: Orthopedics;  Laterality: Left;  58min    There were no vitals filed for this visit.   Subjective Assessment - 09/24/20 1108    Subjective A little sore in back of knee. I took some ibuprofen.    Pertinent History L ankle ORIF 2019.  T11-T12 laminectomy .  Cervical fusion C4-C6    Currently in Pain? No/denies    Pain Score 0-No pain    Pain Location Neck    Pain Score 0     Pain Location Back              [facility] PT Assessment - 09/24/20 0001      Standardized Balance Assessment   Standardized Balance Assessment Five Times Sit to Stand    Five times sit to stand comments  19.8 from mat with UE                         [facility] Adult PT Treatment/Exercise - 09/24/20 0001      Lumbar Exercises: Aerobic   Nustep L7 x 6 min LE only      Lumbar Exercises: Supine   Bridge with Ball Squeeze 20 reps    Straight Leg Raise 10 reps   2 sets each     Knee/Hip Exercises: Machines for Strengthening   Cybex Knee Extension 15lbs bilateral concentric and single RLE for eccentric, also 5# right only concentric and eccentric      Knee/Hip Exercises: Seated   Long Arc Quad Strengthening;1 set;20 reps;Right    Long Arc Quad Limitations blue band looped    Hamstring Curl Strengthening;Right;2 sets    Hamstring Limitations blue band    Sit to [employer] 1 set;10 reps   elevated on airex, UE to rise, eccentric lower     Knee/Hip Exercises: Supine   Short Arc [employer] 20 reps    Short Arc  Quad Sets Limitations 3#      Knee/Hip Exercises: Sidelying   Hip ABduction 10 reps;2 sets;Right;Left                    PT Short Term Goals - 09/24/20 1110      PT SHORT TERM GOAL #1   Title Pt will be able to show independence with HEP for LE and core strengthening    Time 4    Period Weeks    Status Achieved    Target Date 07/18/20      PT SHORT TERM GOAL #2   Title Pt will understand FOTO and potential to improve his condition within 3 visits    Time 2    Period Weeks    Status Achieved      PT SHORT TERM GOAL #3   Title Pt will consistently wear back brace and follow precuations as directed for safety with community mobility    Baseline Pt D/C from back brace per MD    Time 4    Period Weeks    Status Achieved    Target Date 07/18/20      PT SHORT TERM GOAL #4   Title Pt will be able to walk without increased knee pain and improved stride  length , LRAD    Baseline increased buckling in the mornings that gets better during the day, bucking noted with standing therex, using RW, no pain, decreased stride length    Time 4    Period Weeks    Status Partially Met    Target Date 07/18/20             PT Long Term Goals - 08/29/20 1423      PT LONG TERM GOAL #1   Title Pt will improve FOTO to 58% ability or greater for improved functional mobility    Time 8    Period Weeks    Status On-going      PT LONG TERM GOAL #2   Title Pt will be able to ambulate without a walker (cane) community distances with mod I    Time 8    Period Weeks    Status On-going      PT LONG TERM GOAL #3   Title Pt will be able to increase UE and LE strength to 4+/5 to 5/5 for maximal functional mobility and strength    Time 8    Period Weeks    Status On-going      PT LONG TERM GOAL #4   Title Pt will be able to perform 5 x STS from standard chair, no UE needed in 15 sec or less    Baseline elevated mat, 17 sec with UE to walker. 08/29/2020: UE assist during sit<>stand this session    Time 8    Period Weeks    Status On-going      PT LONG TERM GOAL #5   Title Pt will be I with final HEP upon discharge from PT    Time 8    Period Weeks    Status On-going                 Plan - 09/24/20 1227    Clinical Impression Statement Focused LE strength today. He has poor eccentric control to sit on elevated surface. Continued focus on right knee to decrease buckling with ambulation and transfers. Pt noted improved ability to perform resisted open chain strength with control.    PT Next  Visit Plan Progress BLE strengthening; continue gait training, RUE strengthening (grip and shoulder strength), core and glute strengthening, hip FL stretching. Progress interventions on Research officer, political party.    PT Home Exercise Plan [id number] - scapular retraction, TrA bracing, SLR, quad set, LAQ, bridge, grip squeeze with putty, heel raises, toe raises, resisted R  ankle DF (red band), Thomas stretch if able to feel stretch along hip FL without pain in low back           Patient will benefit from skilled therapeutic intervention in order to improve the following deficits and impairments:  Decreased activity tolerance,Decreased knowledge of use of DME,Decreased safety awareness,Decreased strength,Impaired flexibility,Impaired UE functional use,Pain,Obesity,Improper body mechanics,Decreased range of motion,Decreased scar mobility,Decreased knowledge of precautions,Decreased balance,Decreased endurance,Decreased mobility,Decreased skin integrity,Difficulty walking,Impaired sensation  Visit Diagnosis: Status post cervical spinal fusion  Status post lumbar laminectomy  Muscle weakness (generalized)  Difficulty in walking, not elsewhere classified     Problem List Patient Active Problem List   Diagnosis Date Noted  . Thoracic myelopathy 06/05/2020    Dorene Ar, PTA 09/24/2020, 12:31 PM  [facility] [address] [place], [place], [address] Phone: [phone]   Fax:  [phone]  Name: Randy Stewart MRN: [id number] Date of Birth: 1971-05-10

## 2020-09-26 ENCOUNTER — Encounter: Payer: Self-pay | Admitting: Physical Therapy

## 2020-09-26 ENCOUNTER — Ambulatory Visit: Payer: PRIVATE HEALTH INSURANCE | Admitting: Physical Therapy

## 2020-09-26 ENCOUNTER — Other Ambulatory Visit: Payer: Self-pay

## 2020-09-26 DIAGNOSIS — Z9889 Other specified postprocedural states: Secondary | ICD-10-CM

## 2020-09-26 DIAGNOSIS — Z981 Arthrodesis status: Secondary | ICD-10-CM

## 2020-09-26 DIAGNOSIS — R262 Difficulty in walking, not elsewhere classified: Secondary | ICD-10-CM

## 2020-09-26 DIAGNOSIS — M6281 Muscle weakness (generalized): Secondary | ICD-10-CM

## 2020-09-26 NOTE — Therapy (Signed)
[place], [place], [address] Phone: [phone]   Fax:  [phone]  Physical Therapy Treatment  Patient Details  Name: Randy Stewart MRN: [id number] Date of Birth: 05-16-1971 Referring Provider (PT): Dr. Consuella Lose   Encounter Date: 09/26/2020   PT End of Session - 09/26/20 0924    Visit Number 17    Number of Visits 16    Date for PT Re-Evaluation 10/13/20    Authorization Type Generic Commercial    Authorization Time Period 08/04/20 to 11/26/20 (12 visits)    Authorization - Visit Number 8    Authorization - Number of Visits 12    PT Start Time 0850    PT Stop Time 0932    PT Time Calculation (min) 42 min           Past Medical History:  Diagnosis Date  . Anxiety   . Hypertension     Past Surgical History:  Procedure Laterality Date  . ANTERIOR CERVICAL DECOMP/DISCECTOMY FUSION N/A 06/08/2020   Procedure: Anterior Cervical Discectomy Fusion Cervical Four-Five, Cervical Five-Six;  Surgeon: Consuella Lose, MD;  Location: [place];  Service: Neurosurgery;  Laterality: N/A;  . LUMBAR LAMINECTOMY/DECOMPRESSION MICRODISCECTOMY N/A 06/06/2020   Procedure: LUMBAR LAMINECTOMY THORACIC ELEVEN-TWELVE;  Surgeon: Consuella Lose, MD;  Location: [place];  Service: Neurosurgery;  Laterality: N/A;  . NO PAST SURGERIES    . ORIF ANKLE FRACTURE Left 11/30/2018   Procedure: OPEN REDUCTION INTERNAL FIXATION (ORIF) left ankle lateral malleolar fracture;  Surgeon: Wylene Simmer, MD;  Location: [place];  Service: Orthopedics;  Laterality: Left;  40mn    There were no vitals filed for this visit.   Subjective Assessment - 09/26/20 0924    Subjective No pain today,    Currently in Pain? No/denies                             [facility] Adult PT Treatment/Exercise - 09/26/20 0001      Lumbar Exercises: Aerobic   Nustep L7 x 6 min LE only      Knee/Hip Exercises: Machines  for Strengthening   Cybex Knee Extension 15lbs bilateral concentric and single RLE for eccentric, also 5# right only concentric and eccentric    Cybex Knee Flexion 20#  bilat with RLE eccentric , 10# single RLE    Cybex Leg Press 3 plates bilateral, 2 plates single      Knee/Hip Exercises: Seated   Long Arc Quad Strengthening;1 set;20 reps;Right    Long Arc Quad Limitations blue band looped    Sit to [employer]10 reps;2 sets   elevated on airex, UE to rise, eccentric lower                   PT Short Term Goals - 09/24/20 1110      PT SHORT TERM GOAL #1   Title Pt will be able to show independence with HEP for LE and core strengthening    Time 4    Period Weeks    Status Achieved    Target Date 07/18/20      PT SHORT TERM GOAL #2   Title Pt will understand FOTO and potential to improve his condition within 3 visits    Time 2    Period Weeks    Status Achieved      PT SHORT TERM GOAL #3   Title Pt will consistently wear back  brace and follow precuations as directed for safety with community mobility    Baseline Pt D/C from back brace per MD    Time 4    Period Weeks    Status Achieved    Target Date 07/18/20      PT SHORT TERM GOAL #4   Title Pt will be able to walk without increased knee pain and improved stride length , LRAD    Baseline increased buckling in the mornings that gets better during the day, bucking noted with standing therex, using RW, no pain, decreased stride length    Time 4    Period Weeks    Status Partially Met    Target Date 07/18/20             PT Long Term Goals - 08/29/20 1423      PT LONG TERM GOAL #1   Title Pt will improve FOTO to 58% ability or greater for improved functional mobility    Time 8    Period Weeks    Status On-going      PT LONG TERM GOAL #2   Title Pt will be able to ambulate without a walker (cane) community distances with mod I    Time 8    Period Weeks    Status On-going      PT LONG TERM GOAL #3   Title  Pt will be able to increase UE and LE strength to 4+/5 to 5/5 for maximal functional mobility and strength    Time 8    Period Weeks    Status On-going      PT LONG TERM GOAL #4   Title Pt will be able to perform 5 x STS from standard chair, no UE needed in 15 sec or less    Baseline elevated mat, 17 sec with UE to walker. 08/29/2020: UE assist during sit<>stand this session    Time 8    Period Weeks    Status On-going      PT LONG TERM GOAL #5   Title Pt will be I with final HEP upon discharge from PT    Time 8    Period Weeks    Status On-going                 Plan - 09/26/20 0932    Clinical Impression Statement Focused LE strength to reduce right knee buckling. Worked on gym machines and sit-stands with focus on eccentric control on both. He continues to need elevated surface and UE with sit-stands and demonstrates improved partial descent to seat. Noted improved motor control on knee extension machine in RLE.    PT Next Visit Plan Progress BLE strengthening; continue gait training, RUE strengthening (grip and shoulder strength), core and glute strengthening, hip FL stretching. Progress interventions on Research officer, political party.    PT Home Exercise Plan [id number] - scapular retraction, TrA bracing, SLR, quad set, LAQ, bridge, grip squeeze with putty, heel raises, toe raises, resisted R ankle DF (red band), Thomas stretch if able to feel stretch along hip FL without pain in low back           Patient will benefit from skilled therapeutic intervention in order to improve the following deficits and impairments:  Decreased activity tolerance,Decreased knowledge of use of DME,Decreased safety awareness,Decreased strength,Impaired flexibility,Impaired UE functional use,Pain,Obesity,Improper body mechanics,Decreased range of motion,Decreased scar mobility,Decreased knowledge of precautions,Decreased balance,Decreased endurance,Decreased mobility,Decreased skin integrity,Difficulty  walking,Impaired sensation  Visit Diagnosis: Status post cervical spinal fusion  Status post lumbar laminectomy  Muscle weakness (generalized)  Difficulty in walking, not elsewhere classified     Problem List Patient Active Problem List   Diagnosis Date Noted  . Thoracic myelopathy 06/05/2020    Dorene Ar, PTA 09/26/2020, 9:39 AM  [facility] [address] [place], [place], [address] Phone: [phone]   Fax:  [phone]  Name: COLLAN SCHOENFELD MRN: [id number] Date of Birth: 1971-01-26

## 2020-10-01 ENCOUNTER — Ambulatory Visit: Payer: PRIVATE HEALTH INSURANCE | Admitting: Physical Therapy

## 2020-10-01 ENCOUNTER — Other Ambulatory Visit: Payer: Self-pay

## 2020-10-01 ENCOUNTER — Encounter: Payer: Self-pay | Admitting: Physical Therapy

## 2020-10-01 DIAGNOSIS — M6281 Muscle weakness (generalized): Secondary | ICD-10-CM

## 2020-10-01 DIAGNOSIS — Z9889 Other specified postprocedural states: Secondary | ICD-10-CM

## 2020-10-01 DIAGNOSIS — Z981 Arthrodesis status: Secondary | ICD-10-CM | POA: Diagnosis not present

## 2020-10-01 DIAGNOSIS — R262 Difficulty in walking, not elsewhere classified: Secondary | ICD-10-CM

## 2020-10-01 NOTE — Therapy (Signed)
[place], [place], [address] Phone: [phone]   Fax:  [phone]  Physical Therapy Treatment  Patient Details  Name: Randy Stewart MRN: [id number] Date of Birth: 10-03-1970 Referring Provider (PT): Dr. Consuella Lose   Encounter Date: 10/01/2020   PT End of Session - 10/01/20 0806    Visit Number 18    Number of Visits 16    Date for PT Re-Evaluation 10/13/20    Authorization Type Generic Commercial    Authorization Time Period 08/04/20 to 11/26/20 (12 visits)    Authorization - Visit Number 9    Authorization - Number of Visits 12    PT Start Time 0802    PT Stop Time 0845    PT Time Calculation (min) 43 min           Past Medical History:  Diagnosis Date  . Anxiety   . Hypertension     Past Surgical History:  Procedure Laterality Date  . ANTERIOR CERVICAL DECOMP/DISCECTOMY FUSION N/A 06/08/2020   Procedure: Anterior Cervical Discectomy Fusion Cervical Four-Five, Cervical Five-Six;  Surgeon: Consuella Lose, MD;  Location: [place];  Service: Neurosurgery;  Laterality: N/A;  . LUMBAR LAMINECTOMY/DECOMPRESSION MICRODISCECTOMY N/A 06/06/2020   Procedure: LUMBAR LAMINECTOMY THORACIC ELEVEN-TWELVE;  Surgeon: Consuella Lose, MD;  Location: [place];  Service: Neurosurgery;  Laterality: N/A;  . NO PAST SURGERIES    . ORIF ANKLE FRACTURE Left 11/30/2018   Procedure: OPEN REDUCTION INTERNAL FIXATION (ORIF) left ankle lateral malleolar fracture;  Surgeon: Wylene Simmer, MD;  Location: [place];  Service: Orthopedics;  Laterality: Left;  58mn    There were no vitals filed for this visit.   Subjective Assessment - 10/01/20 0805    Subjective Just tightness and soreness in the thigh that gets better as I get moving.    Pain Score 0-No pain    Pain Location Neck    Pain Score 1    Pain Location Leg    Pain Orientation Right;Upper;Anterior    Pain Descriptors / Indicators  Tightness;Sore    Aggravating Factors  just there in the mornings    Pain Relieving Factors get moving                             [facility] - [facility]      Knee/Hip Exercises: Aerobic   Nustep L8  LE for 5 min      Knee/Hip Exercises: Machines for Strengthening   Cybex Knee Extension 15lbs bilateral x 15 then x 15 concentric and single RLE for eccentric, also 10# right only concentric and eccentric x 10    Cybex Knee Flexion 25#  bilat then  with RLE eccentric , 10# single RLE    Cybex Leg Press 3 plates bilateral, 2 plates single      Knee/Hip Exercises: Standing   Forward Step Up Step Height: 6";Hand Hold: 2;Right    Forward Step Up Limitations at hip cybex, heavy UE      Knee/Hip Exercises: Seated   Sit to Sand 10 reps;2 sets   elevated on airex, UE to rise, eccentric lower                   PT Short Term Goals - 09/24/20 1110      PT SHORT TERM GOAL #1   Title Pt will be able to show independence with HEP for LE and  core strengthening    Time 4    Period Weeks    Status Achieved    Target Date 07/18/20      PT SHORT TERM GOAL #2   Title Pt will understand FOTO and potential to improve his condition within 3 visits    Time 2    Period Weeks    Status Achieved      PT SHORT TERM GOAL #3   Title Pt will consistently wear back brace and follow precuations as directed for safety with community mobility    Baseline Pt D/C from back brace per MD    Time 4    Period Weeks    Status Achieved    Target Date 07/18/20      PT SHORT TERM GOAL #4   Title Pt will be able to walk without increased knee pain and improved stride length , LRAD    Baseline increased buckling in the mornings that gets better during the day, bucking noted with standing therex, using RW, no pain, decreased stride length    Time 4    Period Weeks    Status Partially Met    Target Date 07/18/20             PT Long Term Goals - 08/29/20  1423      PT LONG TERM GOAL #1   Title Pt will improve FOTO to 58% ability or greater for improved functional mobility    Time 8    Period Weeks    Status On-going      PT LONG TERM GOAL #2   Title Pt will be able to ambulate without a walker (cane) community distances with mod I    Time 8    Period Weeks    Status On-going      PT LONG TERM GOAL #3   Title Pt will be able to increase UE and LE strength to 4+/5 to 5/5 for maximal functional mobility and strength    Time 8    Period Weeks    Status On-going      PT LONG TERM GOAL #4   Title Pt will be able to perform 5 x STS from standard chair, no UE needed in 15 sec or less    Baseline elevated mat, 17 sec with UE to walker. 08/29/2020: UE assist during sit<>stand this session    Time 8    Period Weeks    Status On-going      PT LONG TERM GOAL #5   Title Pt will be I with final HEP upon discharge from PT    Time 8    Period Weeks    Status On-going                 Plan - 10/01/20 0808    Clinical Impression Statement Pt reports he willl have 2 episodes of right knee buckling in the first hour of his day. Otherwise the rest of the day he does not notice any knee buckling. Continued LE strngth with focus on RLE quad to prevent knee buckling. He was able to complete RLE 6 inch step up with heavy UE assist.    PT Next Visit Plan Progress BLE strengthening; continue gait training (check stride length with RW, RUE strengthening (grip and shoulder strength), core and glute strengthening, hip FL stretching. Progress interventions on Research officer, political party.    PT Home Exercise Plan [id number] - scapular retraction, TrA bracing, SLR, quad set, LAQ, bridge, grip squeeze  with putty, heel raises, toe raises, resisted R ankle DF (red band), Thomas stretch if able to feel stretch along hip FL without pain in low back           Patient will benefit from skilled therapeutic intervention in order to improve the following deficits and  impairments:  Decreased activity tolerance,Decreased knowledge of use of DME,Decreased safety awareness,Decreased strength,Impaired flexibility,Impaired UE functional use,Pain,Obesity,Improper body mechanics,Decreased range of motion,Decreased scar mobility,Decreased knowledge of precautions,Decreased balance,Decreased endurance,Decreased mobility,Decreased skin integrity,Difficulty walking,Impaired sensation  Visit Diagnosis: Status post cervical spinal fusion  Muscle weakness (generalized)  Status post lumbar laminectomy  Difficulty in walking, not elsewhere classified     Problem List Patient Active Problem List   Diagnosis Date Noted  . Thoracic myelopathy 06/05/2020    Dorene Ar, PTA 10/01/2020, 8:56 AM  [facility] [address] [place], [place], [address] Phone: [phone]   Fax:  [phone]  Name: ASWAD WANDREY MRN: [id number] Date of Birth: 20-Sep-1970

## 2020-10-03 ENCOUNTER — Encounter: Payer: Self-pay | Admitting: Physical Therapy

## 2020-10-03 ENCOUNTER — Encounter: Payer: No Typology Code available for payment source | Admitting: Physical Therapy

## 2020-10-03 ENCOUNTER — Ambulatory Visit: Payer: PRIVATE HEALTH INSURANCE | Attending: Neurosurgery | Admitting: Physical Therapy

## 2020-10-03 ENCOUNTER — Other Ambulatory Visit: Payer: Self-pay

## 2020-10-03 DIAGNOSIS — Z981 Arthrodesis status: Secondary | ICD-10-CM | POA: Insufficient documentation

## 2020-10-03 DIAGNOSIS — R262 Difficulty in walking, not elsewhere classified: Secondary | ICD-10-CM | POA: Insufficient documentation

## 2020-10-03 DIAGNOSIS — Z9889 Other specified postprocedural states: Secondary | ICD-10-CM | POA: Diagnosis present

## 2020-10-03 DIAGNOSIS — M6281 Muscle weakness (generalized): Secondary | ICD-10-CM | POA: Diagnosis present

## 2020-10-03 NOTE — Therapy (Signed)
[place], [place], [address] Phone: [phone]   Fax:  [phone]  Physical Therapy Treatment  Patient Details  Name: Randy Stewart MRN: [id number] Date of Birth: 20-Oct-1970 Referring Provider (PT): Dr. Consuella Lose   Encounter Date: 10/03/2020   PT End of Session - 10/03/20 0855    Visit Number 19    Number of Visits 16    Date for PT Re-Evaluation 10/13/20    Authorization Type Generic Commercial    Authorization Time Period 08/04/20 to 11/26/20 (12 visits)    Authorization - Visit Number 10    Authorization - Number of Visits 12    PT Start Time 0848    PT Stop Time 0930    PT Time Calculation (min) 42 min           Past Medical History:  Diagnosis Date  . Anxiety   . Hypertension     Past Surgical History:  Procedure Laterality Date  . ANTERIOR CERVICAL DECOMP/DISCECTOMY FUSION N/A 06/08/2020   Procedure: Anterior Cervical Discectomy Fusion Cervical Four-Five, Cervical Five-Six;  Surgeon: Consuella Lose, MD;  Location: [place];  Service: Neurosurgery;  Laterality: N/A;  . LUMBAR LAMINECTOMY/DECOMPRESSION MICRODISCECTOMY N/A 06/06/2020   Procedure: LUMBAR LAMINECTOMY THORACIC ELEVEN-TWELVE;  Surgeon: Consuella Lose, MD;  Location: [place];  Service: Neurosurgery;  Laterality: N/A;  . NO PAST SURGERIES    . ORIF ANKLE FRACTURE Left 11/30/2018   Procedure: OPEN REDUCTION INTERNAL FIXATION (ORIF) left ankle lateral malleolar fracture;  Surgeon: Wylene Simmer, MD;  Location: [place];  Service: Orthopedics;  Laterality: Left;  67min    There were no vitals filed for this visit.   Subjective Assessment - 10/03/20 0852    Subjective 1/10 pain in right thigh, tightness. I did some stretches this morning. Left knee buckled some last night. No buckling this morning. I think I need to stretch more. I have been focuing on the strengthening.    Currently in Pain? Yes    Pain  Score 0-No pain    Pain Location Neck    Pain Score 1    Pain Location Leg    Pain Orientation Right;Upper    Pain Descriptors / Indicators Tightness    Aggravating Factors  just theres in the mornings    Pain Relieving Factors get moving, do stretches                             [facility] Adult PT Treatment/Exercise - 10/03/20 0001      Lumbar Exercises: Stretches   Active Hamstring Stretch Limitations seated pulling with  strap    Passive Hamstring Stretch Limitations 1 rep 60 sec each    Single Knee to Chest Stretch 3 reps;30 seconds    Hip Flexor Stretch 3 reps;30 seconds    Hip Flexor Stretch Limitations with overpressure by therapist    Figure 4 Stretch Limitations passive Hip IR/ER 10 sec x 3 each      Lumbar Exercises: Supine   Straight Leg Raise 10 reps   2 sets each     Knee/Hip Exercises: Aerobic   Nustep L8  LE for 5 min      Knee/Hip Exercises: Machines for Strengthening   Cybex Leg Press 3 plates bilateral, 2 plates single on right and left      Knee/Hip Exercises: Standing   Forward Step Up Step Height: 6";Left;10 reps;Hand Hold: 2  Knee/Hip Exercises: Seated   Long Arc Quad Strengthening;1 set;20 reps;Right      Knee/Hip Exercises: Sidelying   Hip ABduction 10 reps;2 sets;Right;Left                    PT Short Term Goals - 10/03/20 1004      PT SHORT TERM GOAL #1   Title Pt will be able to show independence with HEP for LE and core strengthening    Time 4    Period Weeks    Status Achieved    Target Date 07/18/20      PT SHORT TERM GOAL #2   Title Pt will understand FOTO and potential to improve his condition within 3 visits    Time 2    Period Weeks    Status Achieved      PT SHORT TERM GOAL #3   Title Pt will consistently wear back brace and follow precuations as directed for safety with community mobility    Baseline Pt D/C from back brace per MD    Time 4    Period Weeks    Status Achieved    Target Date  07/18/20      PT SHORT TERM GOAL #4   Title Pt will be able to walk without increased knee pain and improved stride length , LRAD    Baseline intermittent bucking in evening, worse in mornings, using RW full time. no knee pain.    Time 4    Period Weeks    Status Partially Met    Target Date 07/18/20             PT Long Term Goals - 10/03/20 1006      PT LONG TERM GOAL #1   Title Pt will improve FOTO to 58% ability or greater for improved functional mobility    Time 8    Period Weeks    Status On-going      PT LONG TERM GOAL #2   Title Pt will be able to ambulate without a walker (cane) community distances with mod I    Baseline using RW full time    Time 8    Period Weeks    Status On-going      PT LONG TERM GOAL #3   Title Pt will be able to increase UE and LE strength to 4+/5 to 5/5 for maximal functional mobility and strength    Time 8    Period Weeks    Status On-going      PT LONG TERM GOAL #4   Title Pt will be able to perform 5 x STS from standard chair, no UE needed in 15 sec or less    Baseline 19.8 from mat with UE assist from mat    Time 8    Period Weeks    Status On-going      PT LONG TERM GOAL #5   Title Pt will be I with final HEP upon discharge from PT    Time 8    Period Weeks    Status On-going                 Plan - 10/03/20 1610    Clinical Impression Statement Pt reports left knee buckling last night. He feels the buckling is after PT workouts or the next morning. Focused bilateral LE strengthening and bilateral stretching to address tightness reported in bilateral thighs and hips.    PT Next Visit Plan FOTO status ?  Progress BLE strengthening; continue gait training (check stride length with RW, RUE strengthening (grip and shoulder strength), core and glute strengthening, hip FL stretching. Progress interventions on Research officer, political party.    PT Home Exercise Plan [id number] - scapular retraction, TrA bracing, SLR, quad set, LAQ, bridge,  grip squeeze with putty, heel raises, toe raises, resisted R ankle DF (red band), Thomas stretch if able to feel stretch along hip FL without pain in low back           Patient will benefit from skilled therapeutic intervention in order to improve the following deficits and impairments:  Decreased activity tolerance,Decreased knowledge of use of DME,Decreased safety awareness,Decreased strength,Impaired flexibility,Impaired UE functional use,Pain,Obesity,Improper body mechanics,Decreased range of motion,Decreased scar mobility,Decreased knowledge of precautions,Decreased balance,Decreased endurance,Decreased mobility,Decreased skin integrity,Difficulty walking,Impaired sensation  Visit Diagnosis: Status post cervical spinal fusion  Muscle weakness (generalized)  Status post lumbar laminectomy  Difficulty in walking, not elsewhere classified     Problem List Patient Active Problem List   Diagnosis Date Noted  . Thoracic myelopathy 06/05/2020    Dorene Ar, PTA 10/03/2020, 10:12 AM  [facility] [address] [place], [place], [address] Phone: [phone]   Fax:  [phone]  Name: ALFIO LOESCHER MRN: [id number] Date of Birth: 14-May-1971

## 2020-10-08 ENCOUNTER — Encounter: Payer: No Typology Code available for payment source | Admitting: Physical Therapy

## 2020-10-08 ENCOUNTER — Ambulatory Visit: Payer: PRIVATE HEALTH INSURANCE | Admitting: Physical Therapy

## 2020-10-10 ENCOUNTER — Encounter: Payer: Self-pay | Admitting: Physical Therapy

## 2020-10-10 ENCOUNTER — Ambulatory Visit: Payer: PRIVATE HEALTH INSURANCE | Admitting: Physical Therapy

## 2020-10-10 ENCOUNTER — Other Ambulatory Visit: Payer: Self-pay

## 2020-10-10 DIAGNOSIS — Z9889 Other specified postprocedural states: Secondary | ICD-10-CM

## 2020-10-10 DIAGNOSIS — Z981 Arthrodesis status: Secondary | ICD-10-CM | POA: Diagnosis not present

## 2020-10-10 DIAGNOSIS — M6281 Muscle weakness (generalized): Secondary | ICD-10-CM

## 2020-10-10 DIAGNOSIS — R262 Difficulty in walking, not elsewhere classified: Secondary | ICD-10-CM

## 2020-10-10 NOTE — Therapy (Signed)
[place], [place], [address] Phone: [phone]   Fax:  [phone]  Physical Therapy Treatment  Patient Details  Name: Randy Stewart MRN: [id number] Date of Birth: [birth year] Referring Provider (PT): Dr. Consuella Lose   Encounter Date: 10/10/2020   PT End of Session - 10/10/20 0934    Visit Number 20    Number of Visits 16    Date for PT Re-Evaluation 10/13/20    Authorization Type Generic Commercial    Authorization Time Period 08/04/20 to 11/26/20 (12 visits)    Authorization - Visit Number 11    Authorization - Number of Visits 12    PT Start Time 0930    PT Stop Time 1015    PT Time Calculation (min) 45 min           Past Medical History:  Diagnosis Date  . Anxiety   . Hypertension     Past Surgical History:  Procedure Laterality Date  . ANTERIOR CERVICAL DECOMP/DISCECTOMY FUSION N/A 06/08/2020   Procedure: Anterior Cervical Discectomy Fusion Cervical Four-Five, Cervical Five-Six;  Surgeon: Consuella Lose, MD;  Location: [place];  Service: Neurosurgery;  Laterality: N/A;  . LUMBAR LAMINECTOMY/DECOMPRESSION MICRODISCECTOMY N/A 06/06/2020   Procedure: LUMBAR LAMINECTOMY THORACIC ELEVEN-TWELVE;  Surgeon: Consuella Lose, MD;  Location: [place];  Service: Neurosurgery;  Laterality: N/A;  . NO PAST SURGERIES    . ORIF ANKLE FRACTURE Left 11/30/2018   Procedure: OPEN REDUCTION INTERNAL FIXATION (ORIF) left ankle lateral malleolar fracture;  Surgeon: Wylene Simmer, MD;  Location: [place];  Service: Orthopedics;  Laterality: Left;  79mn    There were no vitals filed for this visit.   Subjective Assessment - 10/10/20 0933    Subjective I have a little tweak on my side, I think I stretched too much yesterday. I had trouble standing up straight when I got up this morning.    Pain Score 3     Pain Location Back    Pain Orientation Right    Pain Descriptors / Indicators --    spike   Pain Frequency Intermittent    Aggravating Factors  over stretching, first wake up    Pain Relieving Factors rest              [facility] PT Assessment - 10/10/20 0001      6 minute walk test results    Aerobic Endurance Distance Walked 517    Endurance additional comments Right knee feels like it will buckle after 4 minutes - no rest breaks                         [facility] Adult PT Treatment/Exercise - 10/10/20 0001      Lumbar Exercises: Aerobic   Nustep --      Knee/Hip Exercises: Aerobic   Nustep L7 x 5 minutes      Knee/Hip Exercises: Machines for Strengthening   Cybex Knee Extension 5# single leg x 10 each, 10# single leg x 10 each    Cybex Knee Flexion 25#  bilat then  with RLE eccentric , 10# single RLE and LLE    Cybex Leg Press 3 plates bilateral, 2 plates single eccentrics on right and left 10 x 2 each, 1 plate for single leg left and right x 10      Knee/Hip Exercises: Standing   Forward Step Up Step Height: 6";Left;10 reps;Hand Hold: 2  PT Short Term Goals - 10/03/20 1004      PT SHORT TERM GOAL #1   Title Pt will be able to show independence with HEP for LE and core strengthening    Time 4    Period Weeks    Status Achieved    Target Date 07/18/20      PT SHORT TERM GOAL #2   Title Pt will understand FOTO and potential to improve his condition within 3 visits    Time 2    Period Weeks    Status Achieved      PT SHORT TERM GOAL #3   Title Pt will consistently wear back brace and follow precuations as directed for safety with community mobility    Baseline Pt D/C from back brace per MD    Time 4    Period Weeks    Status Achieved    Target Date 07/18/20      PT SHORT TERM GOAL #4   Title Pt will be able to walk without increased knee pain and improved stride length , LRAD    Baseline intermittent bucking in evening, worse in mornings, using RW full time. no knee pain.    Time 4    Period Weeks     Status Partially Met    Target Date 07/18/20             PT Long Term Goals - 10/10/20 1030      PT LONG TERM GOAL #1   Title Pt will improve FOTO to 58% ability or greater for improved functional mobility    Time 8    Period Weeks    Status Unable to assess      PT LONG TERM GOAL #2   Title Pt will be able to ambulate without a walker (cane) community distances with mod I    Baseline using RW full time    Period Weeks    Status On-going      PT LONG TERM GOAL #3   Title Pt will be able to increase UE and LE strength to 4+/5 to 5/5 for maximal functional mobility and strength    Time 8    Period Weeks    Status On-going      PT LONG TERM GOAL #4   Title Pt will be able to perform 5 x STS from standard chair, no UE needed in 15 sec or less    Baseline 19.8 from mat with UE assist from mat    Time 8    Period Weeks    Status On-going      PT LONG TERM GOAL #5   Title Pt will be I with final HEP upon discharge from PT    Time 8    Period Weeks    Status On-going                 Plan - 10/10/20 1610    Clinical Impression Statement Pt reports intermittent 3/10 spike in pain along his right side this morning after over stretching yesterday. This morning he was stiff and difficulty coming into upright posture. He reports legs fatigue after he has been on his feet for awhile and then will begin to buckle. 6 MWT-517 ft with RW without rest break. He did demonstrate right knee instability after 4 minutes of walk test. Continued with gym machines, focusing on eccentric control. He demonstrates improved motor control with knee extension and flexion machines. No reports on increased pain except  with right hamstring during last 2 minutes of 6 MWT. Next visit will be 12 of 12 and he will need preauthorization for more visits.    PT Next Visit Plan Needs re-eval to request additional visits- ?HOLD until receive approval? FOTO status ?  Progress BLE strengthening; continue gait  training (check stride length with RW, RUE strengthening (grip and shoulder strength), core and glute strengthening, hip FL stretching. Progress interventions on Research officer, political party.    PT Home Exercise Plan [id number] - scapular retraction, TrA bracing, SLR, quad set, LAQ, bridge, grip squeeze with putty, heel raises, toe raises, resisted R ankle DF (red band), Thomas stretch if able to feel stretch along hip FL without pain in low back           Patient will benefit from skilled therapeutic intervention in order to improve the following deficits and impairments:  Decreased activity tolerance,Decreased knowledge of use of DME,Decreased safety awareness,Decreased strength,Impaired flexibility,Impaired UE functional use,Pain,Obesity,Improper body mechanics,Decreased range of motion,Decreased scar mobility,Decreased knowledge of precautions,Decreased balance,Decreased endurance,Decreased mobility,Decreased skin integrity,Difficulty walking,Impaired sensation  Visit Diagnosis: Status post cervical spinal fusion  Muscle weakness (generalized)  Status post lumbar laminectomy  Difficulty in walking, not elsewhere classified     Problem List Patient Active Problem List   Diagnosis Date Noted  . Thoracic myelopathy 06/05/2020    Dorene Ar, PTA 10/10/2020, 10:30 AM  [facility] [address] [place], [place], [address] Phone: [phone]   Fax:  [phone]  Name: Randy Stewart MRN: [id number] Date of Birth: 1970/08/16

## 2020-10-15 ENCOUNTER — Ambulatory Visit: Payer: PRIVATE HEALTH INSURANCE | Admitting: Physical Therapy

## 2020-10-15 ENCOUNTER — Other Ambulatory Visit: Payer: Self-pay

## 2020-10-15 DIAGNOSIS — M6281 Muscle weakness (generalized): Secondary | ICD-10-CM

## 2020-10-15 DIAGNOSIS — Z9889 Other specified postprocedural states: Secondary | ICD-10-CM

## 2020-10-15 DIAGNOSIS — R262 Difficulty in walking, not elsewhere classified: Secondary | ICD-10-CM

## 2020-10-15 DIAGNOSIS — Z981 Arthrodesis status: Secondary | ICD-10-CM | POA: Diagnosis not present

## 2020-10-15 NOTE — Therapy (Addendum)
[place], [place], [address] Phone: [phone]   Fax:  [phone]  Physical Therapy Treatment/Renewal [name]  Patient Details  Name: Randy Stewart MRN: [id number] Date of Birth: [birth year] Referring Provider (PT): Dr. Consuella Lose   Encounter Date: 10/15/2020   PT End of Session - 10/15/20 0836    Visit Number 21    Authorization Type Generic Commercial    Authorization Time Period 08/04/20 to 11/26/20 (12 visits)    Authorization - Visit Number 12    Authorization - Number of Visits 12    PT Start Time [phone]    PT Stop Time 0915    PT Time Calculation (min) 43 min    Activity Tolerance Patient tolerated treatment well    Behavior During Therapy [facility] for tasks assessed/performed           Past Medical History:  Diagnosis Date  . Anxiety   . Hypertension     Past Surgical History:  Procedure Laterality Date  . ANTERIOR CERVICAL DECOMP/DISCECTOMY FUSION N/A 06/08/2020   Procedure: Anterior Cervical Discectomy Fusion Cervical Four-Five, Cervical Five-Six;  Surgeon: Consuella Lose, MD;  Location: [place];  Service: Neurosurgery;  Laterality: N/A;  . LUMBAR LAMINECTOMY/DECOMPRESSION MICRODISCECTOMY N/A 06/06/2020   Procedure: LUMBAR LAMINECTOMY THORACIC ELEVEN-TWELVE;  Surgeon: Consuella Lose, MD;  Location: [place];  Service: Neurosurgery;  Laterality: N/A;  . NO PAST SURGERIES    . ORIF ANKLE FRACTURE Left 11/30/2018   Procedure: OPEN REDUCTION INTERNAL FIXATION (ORIF) left ankle lateral malleolar fracture;  Surgeon: Wylene Simmer, MD;  Location: [place];  Service: Orthopedics;  Laterality: Left;  82mn    There were no vitals filed for this visit.   Subjective Assessment - 10/15/20 0836    Subjective I woke up in tears this AM.  Back was 10/10.  Now its a 3/10.  Still hard to walk without walker, > 5 min with walker (R knee weak). Still hard to get up from a low  surface.  Still downstairs most of the time, has not walked up the stairs in a long time. Sees MD in about 5-6 weeks.    Currently in Pain? Yes    Pain Score 3     Pain Location Back    Pain Orientation Right;Lower    Pain Descriptors / Indicators Aching    Pain Type Chronic pain;Surgical pain    Pain Onset More than a month ago    Pain Frequency Intermittent              [facility] PT Assessment - 10/15/20 0001      Observation/Other Assessments   Focus on Therapeutic Outcomes (FOTO)  40% ABLE      Strength   Right Shoulder Flexion 4/5    Right Shoulder ABduction 3+/5    Left Shoulder Flexion 4-/5    Left Shoulder ABduction 3+/5    Right Hip Flexion 3/5    Right Hip ABduction 3-/5    Left Hip Flexion 3/5    Right Knee Flexion 4+/5    Right Knee Extension 4+/5    Left Knee Flexion 5/5    Left Knee Extension 5/5    Right Ankle Dorsiflexion 4/5    Left Ankle Dorsiflexion 4/5      Transfers   Five time sit to stand comments  21 sec, standard chair with hands suporting to walker             [facility] - [facility]      Lumbar Exercises: Aerobic   Nustep 6 min LE only      Lumbar Exercises: Machines for Strengthening   Other Lumbar Machine Exercise Row 35 lbs 2 x 10    Other Lumbar Machine Exercise Lat pull down 35 lb x 10 then shoulder ext 25 lbs x 10 , close S for balance      Lumbar Exercises: Sidelying   Clam Both;10 reps    Clam Limitations 2 sets    Hip Abduction Both;10 reps      Knee/Hip Exercises: Machines for Strengthening   Cybex Leg Press 3 plates bilateral x 15, then 4 plates bilateral.  single leg 2 plates x 10                    PT Short Term Goals - 10/15/20 0949      PT SHORT TERM GOAL #1   Title Pt will be able to show independence with HEP for LE and core strengthening    Status Achieved      PT SHORT TERM GOAL #2   Title Pt will understand FOTO and potential to improve his condition within 3 visits     Status Achieved      PT SHORT TERM GOAL #3   Title Pt will consistently wear back brace and follow precuations as directed for safety with community mobility    Status Achieved      PT SHORT TERM GOAL #4   Title Pt will be able to walk without increased knee pain and improved stride length , LRAD    Baseline intermittent bucking in evening, worse in mornings, using RW full time. no knee pain.    Status Partially Met             PT Long Term Goals - 10/15/20 0950      PT LONG TERM GOAL #1   Title Pt will improve FOTO to 58% ability or greater for improved functional mobility    Baseline 40%    Status On-going      PT LONG TERM GOAL #2   Title Pt will be able to ambulate without a walker (cane) community distances with mod I    Baseline uses cane intermittently at home, walker full time in the community    Status On-going      PT LONG TERM GOAL #3   Title Pt will be able to increase UE and LE strength to 4+/5 to 5/5 for maximal functional mobility and strength    Baseline hip weak 3/5 to 3+/5 and UE 3+/5 to 4/5    Status On-going      PT LONG TERM GOAL #4   Title Pt will be able to perform 5 x STS from standard chair, no UE needed in 15 sec or less    Baseline 21 sec today from standard chair, used UE to walker    Status On-going      PT LONG TERM GOAL #5   Title Pt will be I with final HEP upon discharge from PT    Status On-going                 Plan - 10/15/20 0956    Clinical Impression Statement Patient with increased pain this AM, reporting as 10/10 but was able to decrease to 3/10 with medicine and stretching. He shows improvements in all aspects of mobility: standing tolerance, gait, endurance and control of Rt  knee with funcyional mobility.  He will continue to benefit from PT for UE strength, Rt LE and core strength and endurance.  He would like to be abel to walk with a cane safely prior to joining a gym.  Given his initial status when he began PT, is a  drastically improved.    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[employer];Reach Overhead;Stand;[place];Interpersonal Relationship;Occupation;Cleaning;Laundry;Community Activity;Driving;Meal Prep;Shop;Yard Work    Stability/Clinical Decision Making Stable/Uncomplicated    Clinical Decision Making Low    Rehab Potential Excellent    PT Frequency 2x / week    PT Duration 6 weeks    PT Treatment/Interventions ADLs/Self Care Home Management;Cryotherapy;Gait training;Therapeutic exercise;Patient/family education;Taping;Manual techniques;Stair training;Balance training;Functional mobility training;Neuromuscular re-education;Passive range of motion;Therapeutic activities;DME Instruction    PT Next Visit Plan progress gait without walker (cane, parallel bars).  Strength and endurance UE and LE. Core, hips.    PT Home Exercise Plan [id number] - scapular retraction, TrA bracing, SLR, quad set, LAQ, bridge, grip squeeze with putty, heel raises, toe raises, resisted R ankle DF (red band), Thomas stretch if able to feel stretch along hip FL without pain in low back           Patient will benefit from skilled therapeutic intervention in order to improve the following deficits and impairments:  Decreased activity tolerance,Decreased knowledge of use of DME,Decreased safety awareness,Decreased strength,Impaired flexibility,Impaired UE functional use,Pain,Obesity,Improper body mechanics,Decreased range of motion,Decreased scar mobility,Decreased knowledge of precautions,Decreased balance,Decreased endurance,Decreased mobility,Decreased skin integrity,Difficulty walking,Impaired sensation  Visit Diagnosis: Status post cervical spinal fusion  Muscle  weakness (generalized)  Status post lumbar laminectomy  Difficulty in walking, not elsewhere classified     Problem List Patient Active Problem List   Diagnosis Date Noted  . 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 [place], [place], [address] Phone: [phone]   Fax:  [phone]  Name: ERVIE MCCARD MRN: [id number] Date of Birth: 1970-09-26  Raeford Razor, PT 10/15/20 10:53 AM Phone: [phone] Fax: [phone]   Patient did not come back after this renewal, had some insurance issues.   Raeford Razor, PT 12/03/20 3:49 PM Phone: [phone] Fax: [phone]

## 2020-10-17 ENCOUNTER — Ambulatory Visit: Payer: PRIVATE HEALTH INSURANCE | Admitting: Physical Therapy

## 2020-10-18 ENCOUNTER — Encounter: Payer: PRIVATE HEALTH INSURANCE | Admitting: Physical Therapy

## 2021-02-10 IMAGING — RF DG THORACOLUMBAR SPINE 2V
1 series · 3 of 3 positions shown · non-contrast
Comparison: MRI same day

CLINICAL DATA: T11-12 laminectomy

EXAM:
THORACOLUMBAR SPINE 1V

[Series 1: run · 3 of 3 slices shown]
[im 1/3]
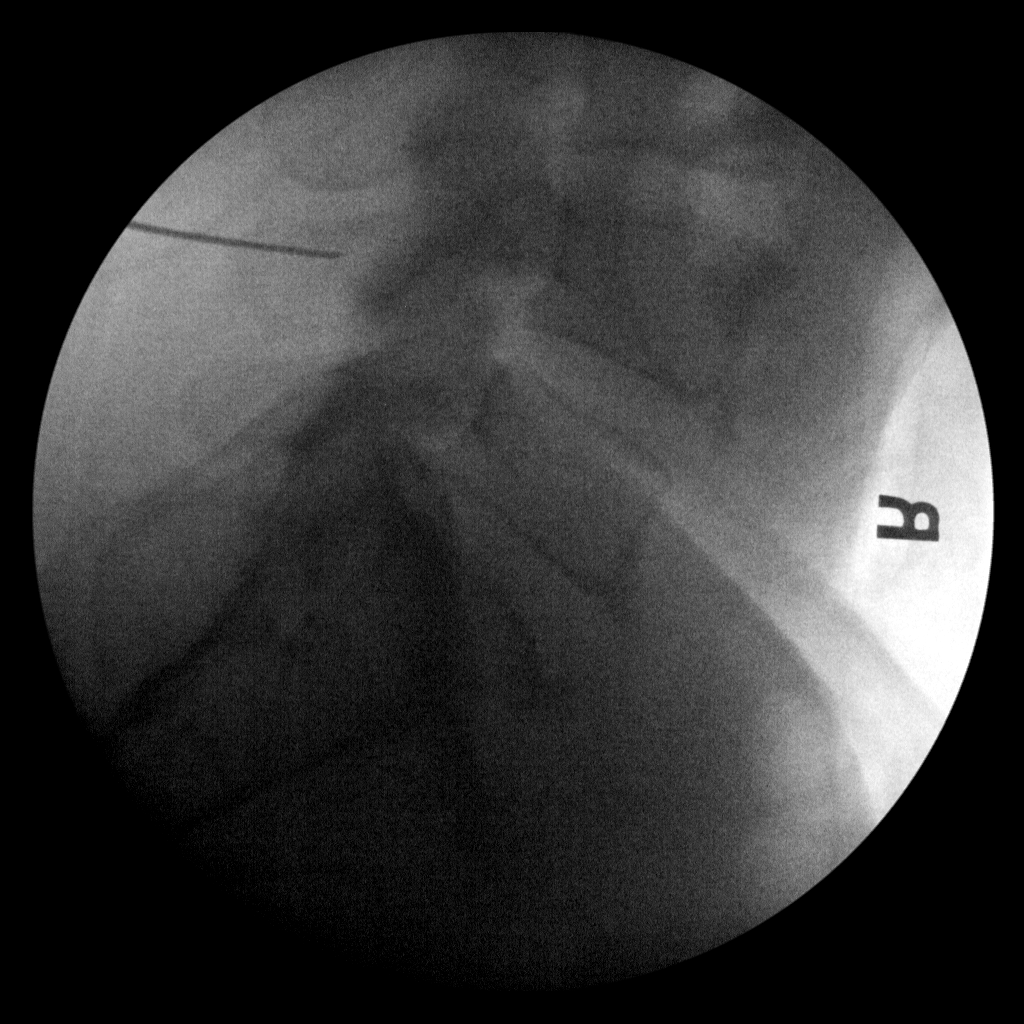
[im 2/3]
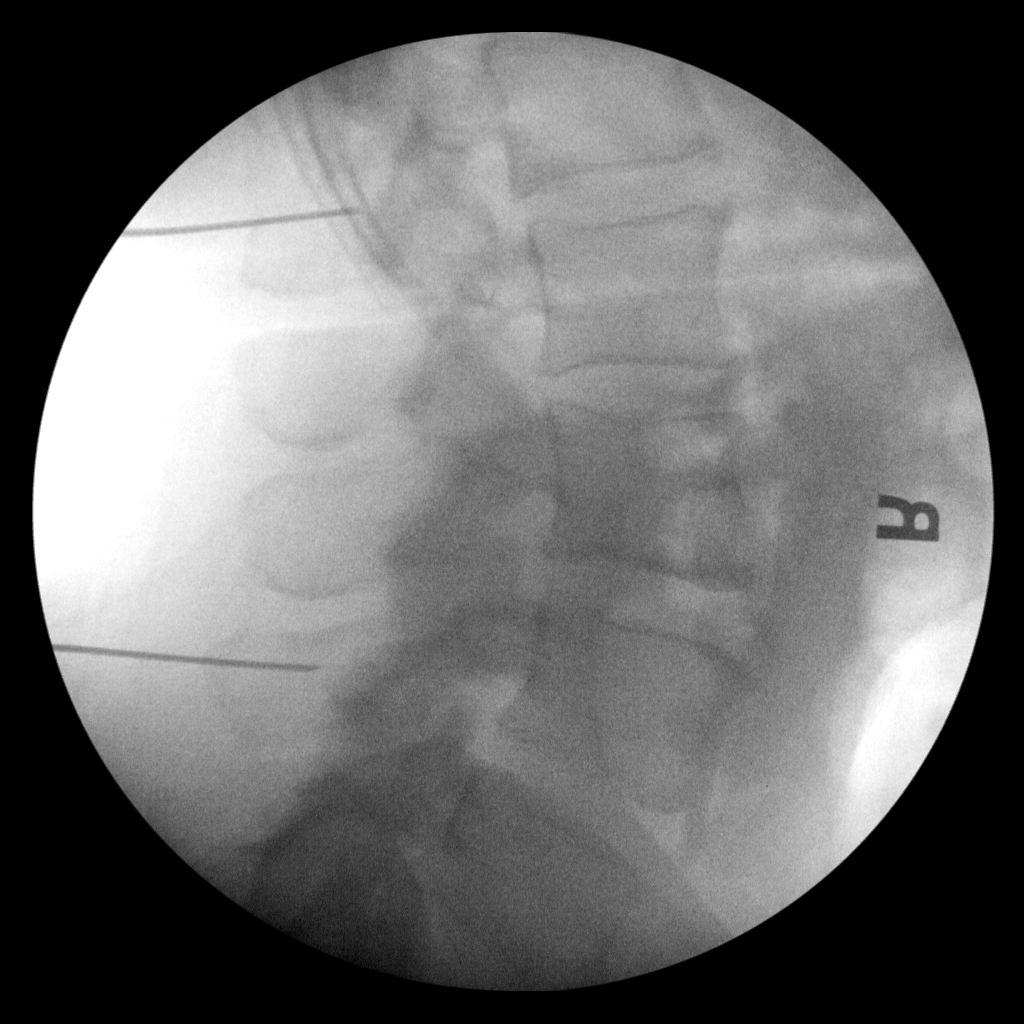
[im 3/3]
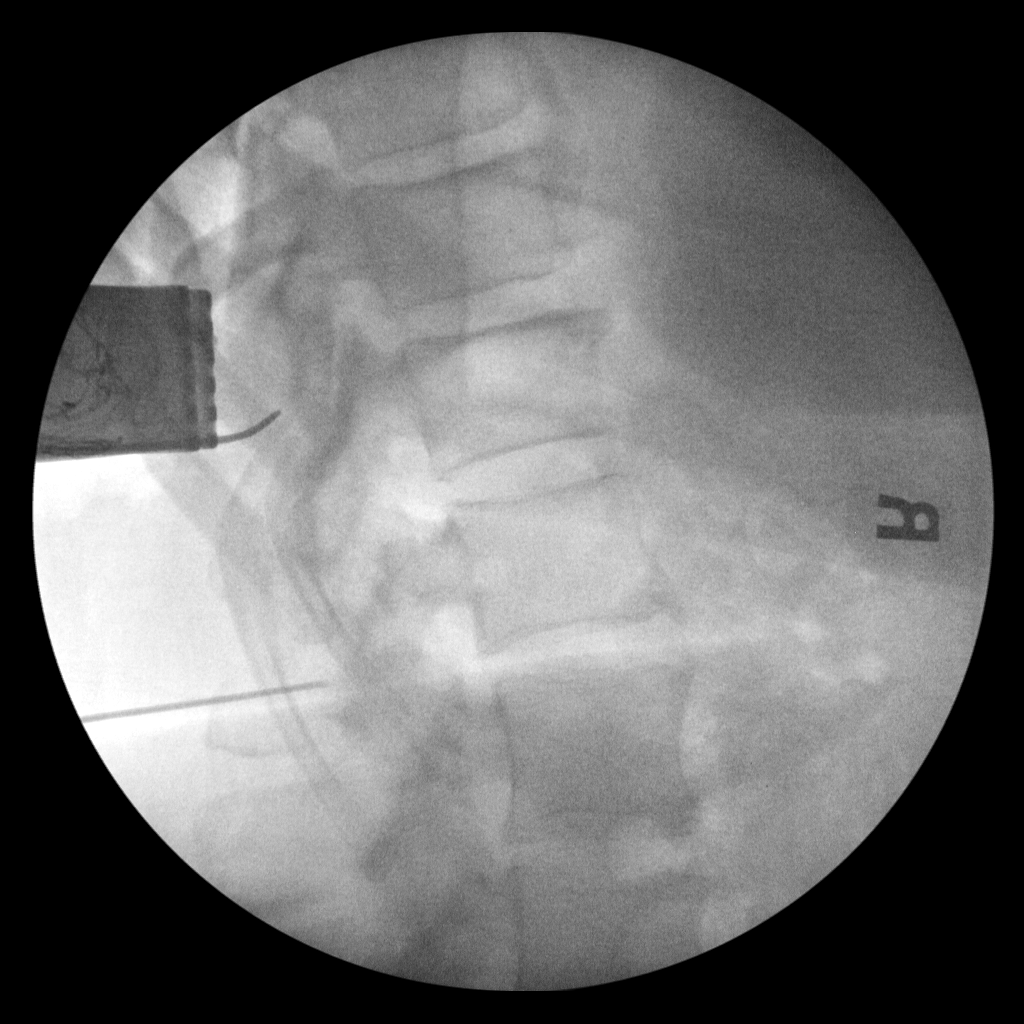

[3 of 3 positions shown; findings below may reference images not displayed]

FINDINGS: Initial C-arm image shows a needle at the spinous process of L4.
Second film shows an additional needle at the superior margin of the
spinous process of L1. A third image shows a probe posterior to the
T12 pedicle level.
IMPRESSION: T12 pedicle level localized.

## 2021-02-12 IMAGING — RF DG CERVICAL SPINE 2 OR 3 VIEWS
1 series · 3 of 3 positions shown · non-contrast
Comparison: Cervical spine MRI 06/06/2020.

CLINICAL DATA: Elective surgery. Additional history provided: C4-C5
ACDF. Provided fluoroscopy time 8 seconds.

EXAM:
CERVICAL SPINE - 2-3 VIEW; DG C-ARM 1-60 MIN

[Series 1: run · 3 of 3 slices shown]
[im 1/3]
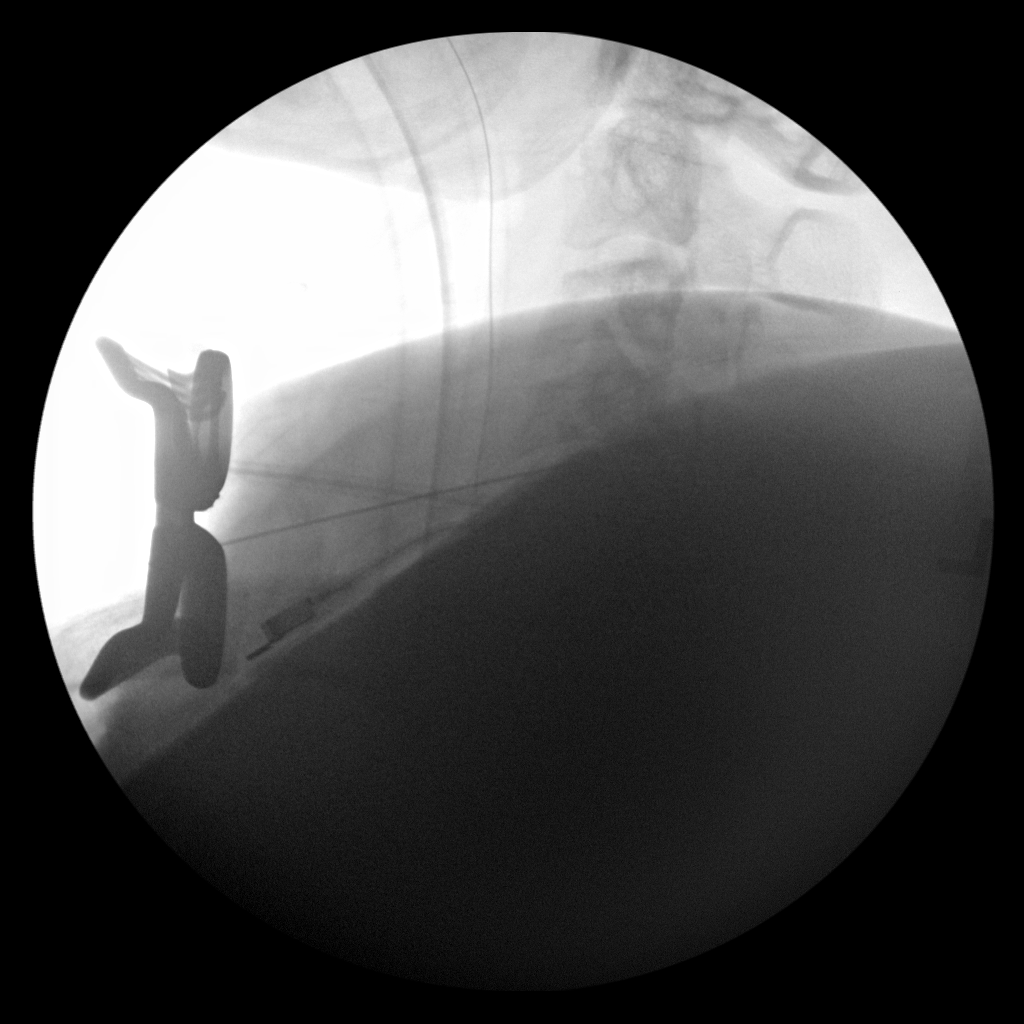
[im 2/3]
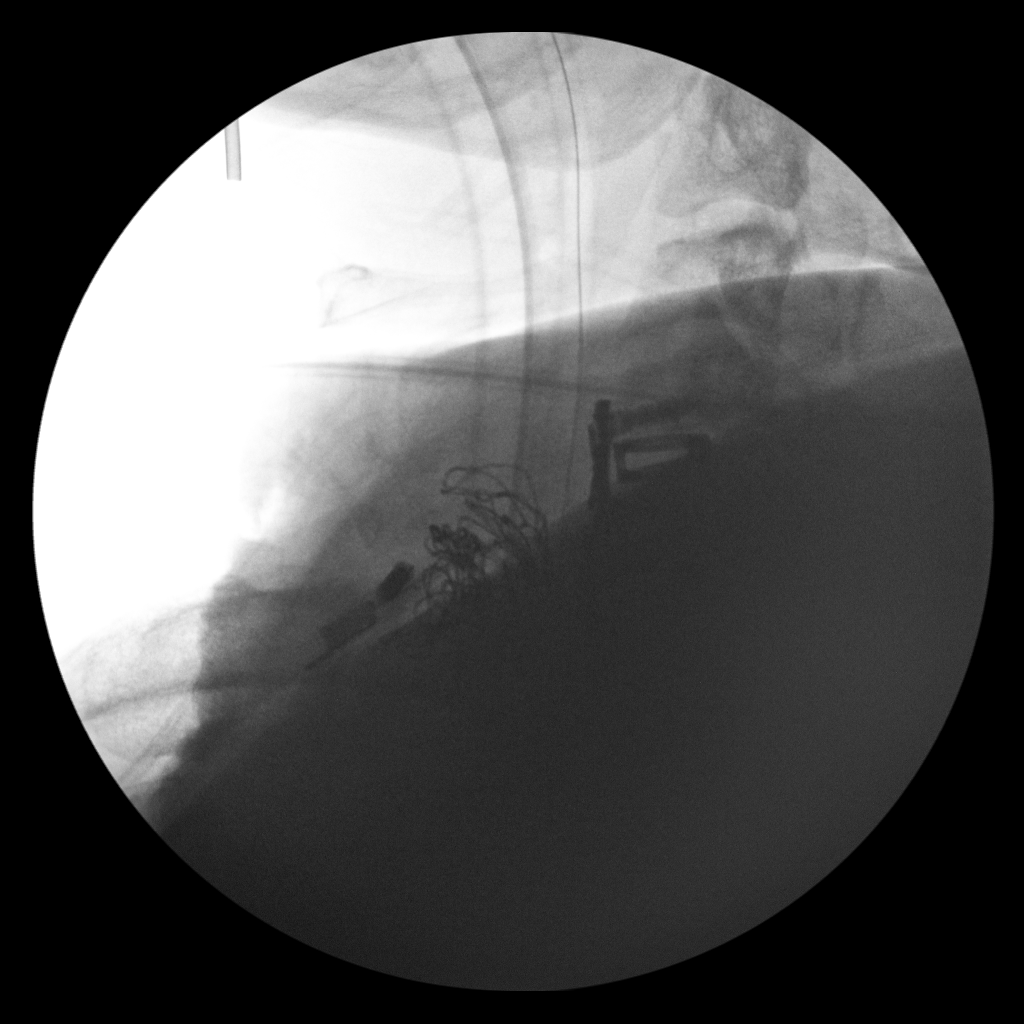
[im 3/3]
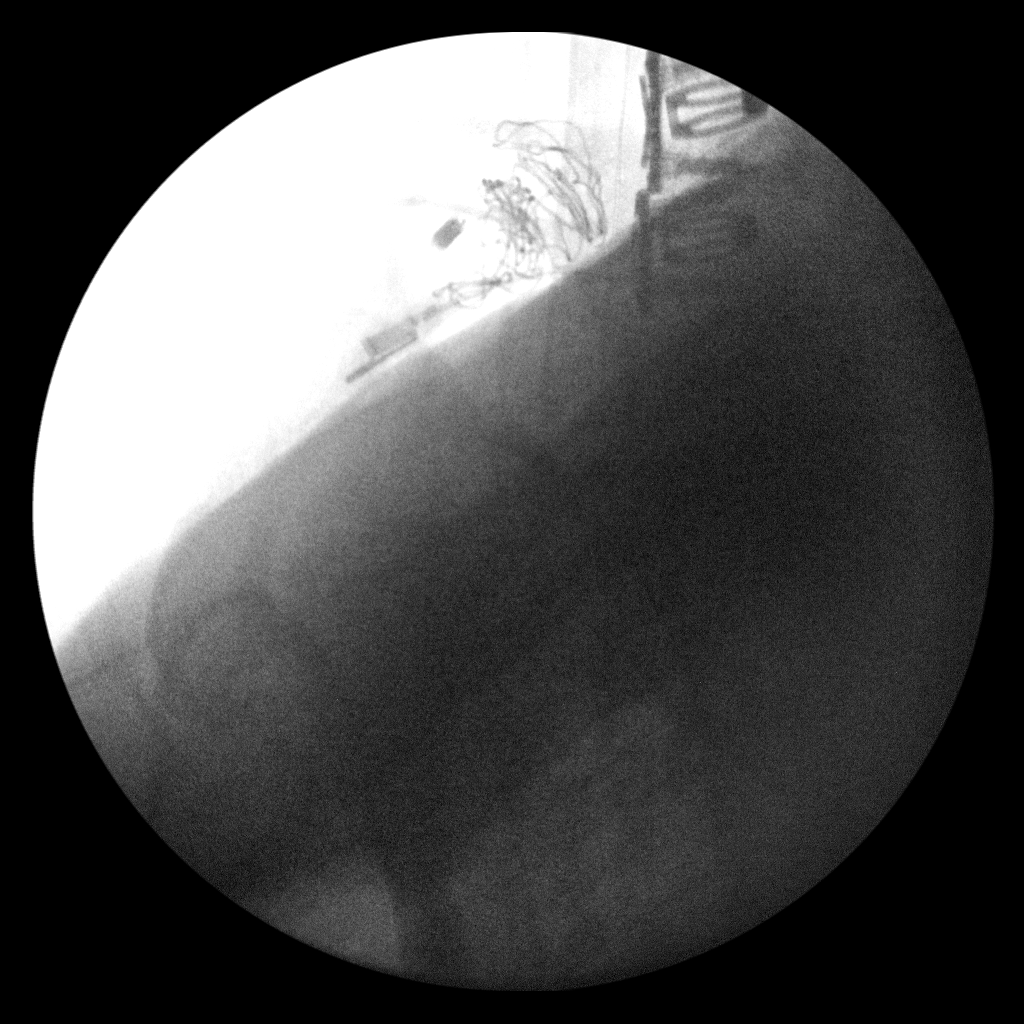

[3 of 3 positions shown; findings below may reference images not displayed]

FINDINGS: Three intraoperative lateral view fluoroscopic images of the
cervical spine are submitted. Superimposition of the patient's
shoulders limits evaluation. On the final images acquired at [DATE]
and [DATE] p.m., ACDF hardware and interbody spacers are present at
what appear to be the C4-C5 and C5-C6 levels. Curvilinear
hyperdensity projects ventral to these levels. Partially imaged
support tubes.
IMPRESSION: Three lateral view intraoperative fluoroscopic images of the
cervical spine as described. ACDF hardware and interbody spacers at
what appear to be the C4-C5 and C5-C6 levels.

Curvilinear hyperdensity projects ventral to the C4-C6 levels on the
final image and this may reflect a surgical sponge/packing material.
Correlate with the operative history.

## 2021-04-01 DIAGNOSIS — M4804 Spinal stenosis, thoracic region: Secondary | ICD-10-CM | POA: Diagnosis not present

## 2021-04-01 DIAGNOSIS — M4802 Spinal stenosis, cervical region: Secondary | ICD-10-CM | POA: Diagnosis not present

## 2021-04-01 DIAGNOSIS — Z6841 Body Mass Index (BMI) 40.0 and over, adult: Secondary | ICD-10-CM | POA: Diagnosis not present

## 2021-04-01 DIAGNOSIS — R030 Elevated blood-pressure reading, without diagnosis of hypertension: Secondary | ICD-10-CM | POA: Diagnosis not present

## 2022-03-26 DIAGNOSIS — Z23 Encounter for immunization: Secondary | ICD-10-CM | POA: Diagnosis not present

## 2022-03-26 DIAGNOSIS — I10 Essential (primary) hypertension: Secondary | ICD-10-CM | POA: Diagnosis not present

## 2022-03-26 DIAGNOSIS — Z13220 Encounter for screening for lipoid disorders: Secondary | ICD-10-CM | POA: Diagnosis not present

## 2022-03-26 DIAGNOSIS — Z125 Encounter for screening for malignant neoplasm of prostate: Secondary | ICD-10-CM | POA: Diagnosis not present

## 2022-03-26 DIAGNOSIS — Z0000 Encounter for general adult medical examination without abnormal findings: Secondary | ICD-10-CM | POA: Diagnosis not present

## 2022-09-26 DIAGNOSIS — R7303 Prediabetes: Secondary | ICD-10-CM | POA: Diagnosis not present

## 2023-05-18 DIAGNOSIS — I10 Essential (primary) hypertension: Secondary | ICD-10-CM | POA: Diagnosis not present

## 2023-05-18 DIAGNOSIS — Z0000 Encounter for general adult medical examination without abnormal findings: Secondary | ICD-10-CM | POA: Diagnosis not present

## 2023-05-18 DIAGNOSIS — M4714 Other spondylosis with myelopathy, thoracic region: Secondary | ICD-10-CM | POA: Diagnosis not present

## 2023-05-18 DIAGNOSIS — Z9181 History of falling: Secondary | ICD-10-CM | POA: Diagnosis not present

## 2023-05-18 DIAGNOSIS — Z23 Encounter for immunization: Secondary | ICD-10-CM | POA: Diagnosis not present

## 2023-05-18 DIAGNOSIS — Z6841 Body Mass Index (BMI) 40.0 and over, adult: Secondary | ICD-10-CM | POA: Diagnosis not present

## 2023-05-18 DIAGNOSIS — Z125 Encounter for screening for malignant neoplasm of prostate: Secondary | ICD-10-CM | POA: Diagnosis not present

## 2023-05-18 DIAGNOSIS — Z5181 Encounter for therapeutic drug level monitoring: Secondary | ICD-10-CM | POA: Diagnosis not present

## 2023-06-30 DIAGNOSIS — I10 Essential (primary) hypertension: Secondary | ICD-10-CM | POA: Diagnosis not present

## 2023-06-30 DIAGNOSIS — R748 Abnormal levels of other serum enzymes: Secondary | ICD-10-CM | POA: Diagnosis not present

## 2023-09-28 DIAGNOSIS — R748 Abnormal levels of other serum enzymes: Secondary | ICD-10-CM | POA: Diagnosis not present

## 2024-03-03 DIAGNOSIS — M4714 Other spondylosis with myelopathy, thoracic region: Secondary | ICD-10-CM | POA: Diagnosis not present

## 2024-03-03 DIAGNOSIS — I10 Essential (primary) hypertension: Secondary | ICD-10-CM | POA: Diagnosis not present

## 2024-04-03 DIAGNOSIS — M4714 Other spondylosis with myelopathy, thoracic region: Secondary | ICD-10-CM | POA: Diagnosis not present

## 2024-04-03 DIAGNOSIS — I10 Essential (primary) hypertension: Secondary | ICD-10-CM | POA: Diagnosis not present

## 2024-05-03 DIAGNOSIS — I10 Essential (primary) hypertension: Secondary | ICD-10-CM | POA: Diagnosis not present

## 2024-05-03 DIAGNOSIS — M4714 Other spondylosis with myelopathy, thoracic region: Secondary | ICD-10-CM | POA: Diagnosis not present

## 2024-05-20 DIAGNOSIS — Z1331 Encounter for screening for depression: Secondary | ICD-10-CM | POA: Diagnosis not present

## 2024-05-20 DIAGNOSIS — Z23 Encounter for immunization: Secondary | ICD-10-CM | POA: Diagnosis not present

## 2024-05-20 DIAGNOSIS — Z0000 Encounter for general adult medical examination without abnormal findings: Secondary | ICD-10-CM | POA: Diagnosis not present

## 2024-05-20 NOTE — Progress Notes (Addendum)
 Randy Stewart                                          MRN: [id number]   07/26/2024   The [facility] Quality Team Specialist reviewed this patient medical record for the purposes of chart review for care gap closure. The following were reviewed: chart review for care gap closure-controlling blood pressure.    [facility] Quality Team

## 2024-06-03 DIAGNOSIS — M4714 Other spondylosis with myelopathy, thoracic region: Secondary | ICD-10-CM | POA: Diagnosis not present

## 2024-06-03 DIAGNOSIS — I10 Essential (primary) hypertension: Secondary | ICD-10-CM | POA: Diagnosis not present

## 2024-06-09 DIAGNOSIS — R748 Abnormal levels of other serum enzymes: Secondary | ICD-10-CM | POA: Diagnosis not present

## 2024-07-03 DIAGNOSIS — I10 Essential (primary) hypertension: Secondary | ICD-10-CM | POA: Diagnosis not present

## 2024-07-03 DIAGNOSIS — M4714 Other spondylosis with myelopathy, thoracic region: Secondary | ICD-10-CM | POA: Diagnosis not present

## 2024-08-16 NOTE — Progress Notes (Signed)
 Randy Stewart                                          MRN: [id number]   08/16/2024   The [facility] Quality Team Specialist reviewed this patient medical record for the purposes of chart review for care gap closure. The following were reviewed: chart review for care gap closure-controlling blood pressure.    [facility] Quality Team
